# Patient Record
Sex: Male | Born: 1950 | Race: White | Hispanic: No | Marital: Married | Smoking: Never smoker
Health system: Southern US, Community
[De-identification: ages and names within clinical notes are randomized; demographics above are authoritative.]

## PROBLEM LIST (undated history)

## (undated) ENCOUNTER — Emergency Department (HOSPITAL_BASED_OUTPATIENT_CLINIC_OR_DEPARTMENT_OTHER): Admission: EM | Payer: Self-pay | Source: Home / Self Care

## (undated) DIAGNOSIS — H9190 Unspecified hearing loss, unspecified ear: Secondary | ICD-10-CM

## (undated) DIAGNOSIS — Z923 Personal history of irradiation: Secondary | ICD-10-CM

## (undated) DIAGNOSIS — I499 Cardiac arrhythmia, unspecified: Secondary | ICD-10-CM

## (undated) DIAGNOSIS — I493 Ventricular premature depolarization: Secondary | ICD-10-CM

## (undated) DIAGNOSIS — I82409 Acute embolism and thrombosis of unspecified deep veins of unspecified lower extremity: Secondary | ICD-10-CM

## (undated) DIAGNOSIS — E785 Hyperlipidemia, unspecified: Secondary | ICD-10-CM

## (undated) DIAGNOSIS — Z8719 Personal history of other diseases of the digestive system: Secondary | ICD-10-CM

## (undated) DIAGNOSIS — I471 Supraventricular tachycardia, unspecified: Secondary | ICD-10-CM

## (undated) DIAGNOSIS — I10 Essential (primary) hypertension: Secondary | ICD-10-CM

## (undated) DIAGNOSIS — C61 Malignant neoplasm of prostate: Secondary | ICD-10-CM

## (undated) DIAGNOSIS — K219 Gastro-esophageal reflux disease without esophagitis: Secondary | ICD-10-CM

## (undated) HISTORY — PX: COLONOSCOPY: SHX174

## (undated) HISTORY — PX: KNEE SURGERY: SHX244

---

## 1998-06-03 ENCOUNTER — Encounter: Payer: Self-pay | Admitting: Family Medicine

## 1998-06-03 ENCOUNTER — Encounter: Admission: RE | Admit: 1998-06-03 | Discharge: 1998-06-03 | Payer: Self-pay | Admitting: *Deleted

## 1998-09-27 ENCOUNTER — Ambulatory Visit (HOSPITAL_COMMUNITY): Admission: RE | Admit: 1998-09-27 | Discharge: 1998-09-27 | Payer: Self-pay

## 1998-09-27 ENCOUNTER — Encounter: Payer: Self-pay | Admitting: Gastroenterology

## 1999-03-09 ENCOUNTER — Inpatient Hospital Stay (HOSPITAL_COMMUNITY): Admission: RE | Admit: 1999-03-09 | Discharge: 1999-03-10 | Payer: Self-pay | Admitting: Neurosurgery

## 1999-03-09 ENCOUNTER — Encounter: Payer: Self-pay | Admitting: Neurosurgery

## 2001-07-15 ENCOUNTER — Ambulatory Visit (HOSPITAL_COMMUNITY): Admission: RE | Admit: 2001-07-15 | Discharge: 2001-07-15 | Payer: Self-pay | Admitting: Surgery

## 2002-03-21 ENCOUNTER — Encounter (INDEPENDENT_AMBULATORY_CARE_PROVIDER_SITE_OTHER): Payer: Self-pay | Admitting: *Deleted

## 2002-03-21 ENCOUNTER — Ambulatory Visit (HOSPITAL_COMMUNITY): Admission: RE | Admit: 2002-03-21 | Discharge: 2002-03-21 | Payer: Self-pay | Admitting: Gastroenterology

## 2002-08-07 HISTORY — PX: BACK SURGERY: SHX140

## 2003-04-08 ENCOUNTER — Encounter: Payer: Self-pay | Admitting: Cardiology

## 2003-04-08 ENCOUNTER — Ambulatory Visit (HOSPITAL_COMMUNITY): Admission: RE | Admit: 2003-04-08 | Discharge: 2003-04-08 | Payer: Self-pay | Admitting: Cardiology

## 2004-08-07 HISTORY — PX: HERNIA REPAIR: SHX51

## 2005-04-28 ENCOUNTER — Ambulatory Visit (HOSPITAL_BASED_OUTPATIENT_CLINIC_OR_DEPARTMENT_OTHER): Admission: RE | Admit: 2005-04-28 | Discharge: 2005-04-28 | Payer: Self-pay | Admitting: Surgery

## 2005-04-28 ENCOUNTER — Encounter (INDEPENDENT_AMBULATORY_CARE_PROVIDER_SITE_OTHER): Payer: Self-pay | Admitting: *Deleted

## 2007-11-14 ENCOUNTER — Encounter: Admission: RE | Admit: 2007-11-14 | Discharge: 2007-11-14 | Payer: Self-pay | Admitting: Family Medicine

## 2008-01-11 ENCOUNTER — Emergency Department (HOSPITAL_COMMUNITY): Admission: EM | Admit: 2008-01-11 | Discharge: 2008-01-11 | Payer: Self-pay | Admitting: Family Medicine

## 2008-09-20 ENCOUNTER — Ambulatory Visit: Payer: Self-pay | Admitting: Occupational Medicine

## 2008-09-22 ENCOUNTER — Telehealth (INDEPENDENT_AMBULATORY_CARE_PROVIDER_SITE_OTHER): Payer: Self-pay | Admitting: Occupational Medicine

## 2008-11-18 ENCOUNTER — Encounter: Payer: Self-pay | Admitting: Cardiology

## 2008-12-04 ENCOUNTER — Encounter: Payer: Self-pay | Admitting: Cardiology

## 2008-12-07 ENCOUNTER — Ambulatory Visit: Payer: Self-pay

## 2008-12-28 ENCOUNTER — Ambulatory Visit: Payer: Self-pay | Admitting: Cardiology

## 2008-12-28 DIAGNOSIS — R002 Palpitations: Secondary | ICD-10-CM | POA: Insufficient documentation

## 2008-12-28 DIAGNOSIS — I4949 Other premature depolarization: Secondary | ICD-10-CM | POA: Insufficient documentation

## 2008-12-29 ENCOUNTER — Encounter: Payer: Self-pay | Admitting: Cardiology

## 2008-12-29 ENCOUNTER — Ambulatory Visit: Payer: Self-pay

## 2009-01-29 ENCOUNTER — Encounter: Payer: Self-pay | Admitting: Cardiology

## 2009-02-02 ENCOUNTER — Ambulatory Visit: Payer: Self-pay | Admitting: Cardiology

## 2009-02-02 DIAGNOSIS — R079 Chest pain, unspecified: Secondary | ICD-10-CM | POA: Insufficient documentation

## 2009-02-02 DIAGNOSIS — E785 Hyperlipidemia, unspecified: Secondary | ICD-10-CM | POA: Insufficient documentation

## 2009-02-02 DIAGNOSIS — I10 Essential (primary) hypertension: Secondary | ICD-10-CM | POA: Insufficient documentation

## 2009-02-10 ENCOUNTER — Telehealth (INDEPENDENT_AMBULATORY_CARE_PROVIDER_SITE_OTHER): Payer: Self-pay

## 2009-02-11 ENCOUNTER — Ambulatory Visit: Payer: Self-pay

## 2009-02-11 ENCOUNTER — Encounter: Payer: Self-pay | Admitting: Cardiology

## 2009-02-15 ENCOUNTER — Telehealth: Payer: Self-pay | Admitting: Cardiology

## 2009-03-05 ENCOUNTER — Ambulatory Visit: Payer: Self-pay | Admitting: Cardiology

## 2010-12-23 NOTE — Op Note (Signed)
[place]  Patient:    [name], [name] Visit Number: [id number] MRN: [id number]          Service Type: DSU Location: DAY Attending Physician:  Andre Lefort Dictated by:   Sandria Bales. Ezzard Standing, M.D. Proc. Date: 07/15/01 Admit Date:  07/15/2001   CC:         [name]. Artis Flock, M.D.  Genene Churn. Sherin Quarry, M.D.   Operative Report  DATE OF BIRTH:  1951/07/15.  PREOPERATIVE DIAGNOSIS:  Right inguinal hernia.  POSTOPERATIVE DIAGNOSIS:  Medium-sized direct right inguinal hernia.  PROCEDURE:  Laparoscopic repair of right inguinal hernia with mesh.  SURGEON:  Sandria Bales. Ezzard Standing, M.D.  ANESTHESIA:  General endotracheal.  ESTIMATED BLOOD LOSS:  Minimal.  INDICATION FOR PROCEDURE:  [name] is a 60 year old white male who has a symptomatic right inguinal hernia and now comes for repair of this hernia. Discussed with him both open and laparoscopic techniques, and wanted to proceed with laparoscopic surgery.  Discussed with him the indications and complications of the procedure.  DESCRIPTION OF PROCEDURE:  The patient was placed in the supine position and given a general endotracheal anesthetic.  He was given 1 g of Ancef at the initiation of the procedure.  His abdomen was shaved, prepped with Betadine solution, and sterilely draped.  A Foley catheter was placed.  An infraumbilical incision was made with sharp dissection and carried down to the rectus abdominis fascia.  I then went to the right of the rectus abdominis, went to the right of the anterior rectus fascia on the right, retracted the rectus abdominis muscle anteriorly, and placed a preperitoneal balloon into the preperitoneal space.  I finished placing the balloon; however, the dissection was actually fairly poor as far as getting out to the pubic bone. I then placed a trocar in the left lower quadrant, dissected down to the pubic bone, identified pubic bone, Coopers ligament.   The patient had a medium to almost large direct hernia in the lateral inguinal floor, which was probably about 2-3 cm in diameter.  I freed up and reduce this hernia without difficulty.  I then placed a second trocar in the right lower quadrant, identified the cord structures.  He had a lipoma of the cord, which I reduced. I then encircled the cord, found no evidence of an indirect inguinal hernia.  I then carried out an inguinal hernia floor repair using a piece of mesh which was cut to 4 x 6 inches.  It was stapled using the [place] [employer] staples medially to the pubic tubercle, inferiorly to the Coopers ligament, superiorly to transversalis fascia, and superior laterally under direct palpation out to the shelving edge of the inguinal ligament.  I avoided the area lateral to the femoral vessels and inferior to the shelving edge of the inguinal ligament.  This mesh lay down flat and appeared to cover the hernia defect well.  I then removed the trocars under direct visualization.  There was no bleeding at either trocar site.  I closed the fascia with a 0 Vicryl suture and the skin with a 5-0 Vicryl suture, painted each wound with tincture of benzoin and Steri-Strips, and sterilely dressed the wound.  The patient tolerated the procedure well, will be discharged home today, return to see me in 10-14 days.  No driving for three or four days.  No heavy lifting for four weeks. Dictated by:   Sandria Bales. Ezzard Standing, M.D. Attending Physician:  Andre Lefort  DD:  07/15/01 TD:  07/15/01 Job: [id number]

## 2010-12-23 NOTE — Cardiovascular Report (Signed)
NAME:  [name], [name] NO.:  [id number]   MEDICAL RECORD NO.:  [id number]                   PATIENT TYPE:  OIB   LOCATION:  2870                                 FACILITY:  [facility]   PHYSICIAN:  Charlies Constable, M.D.                  DATE OF BIRTH:  06/10/1951   DATE OF PROCEDURE:  04/08/2003  DATE OF DISCHARGE:  04/08/2003                              CARDIAC CATHETERIZATION   CLINICAL HISTORY:  [name] is 60 years old and is the husband of [name] who works as a Engineer, civil (consulting) at [employer].  He works at [employer].  He  recently developed pressure in his chest with walking up a hill which  persisted.  He became a bit sweaty and warm.  He saw [name], M.D.  who was concerned about his symptoms and referred him to me for further  evaluation.  I saw him in consultation.  He has a positive family history  for coronary heart disease and has a history of hypertension and  hyperlipidemia.  His electrocardiogram also showed small inferior Q-waves.  We felt evaluation with angiography was the best approach.   PROCEDURE:  The procedure was performed via the right femoral artery  utilizing an arterial sheath and 6-French preformed coronary catheters.  A  front wall arterial puncture was performed and Omnipaque contrast was used.  A distal aortogram was performed to rule out abdominal aortic aneurysm and  evaluate for renovascular causes of hypertension.  The right femoral artery  was closed with Perclose at the end of the procedure.  The patient tolerated  the procedure well and left the laboratory in satisfactory condition.   RESULTS:  Left main coronary artery:  Free of significant disease.   Left anterior descending artery:  Gave rise to three septal perforators and  two diagonal branches.  The LAD was irregular in its proximal portion but  there was no significant obstruction.   Circumflex artery:  Gave rise to a marginal branch and two  posterolateral  branches.  These vessels were free of significant disease.   Right coronary artery:  Moderate sized vessel that gave rise to a conus  branch, right ventricular branch, a posterior descending branch, and a  posterolateral branch.  There were minimal irregularities in the proximal  portion.  There was no significant obstruction.   LEFT VENTRICULOGRAM:  The left ventriculogram performed in the RAO  projection showed good wall motion with no areas of hypokinesis.  The  estimated ejection fraction was 60.   DISTAL AORTOGRAM:  A distal aortogram was performed which showed patent  renal arteries and no significant aortoiliac obstruction.  The aortic  pressure was 127/85 with a mean of 105 and left ventricular pressure was  127/15.   CONCLUSIONS:  Normal coronary angiography with the exception of minimal  irregularity in the proximal left anterior descending and right coronary  arteries and normal left ventricular function.    RECOMMENDATIONS:  Reassurance.  In view of these findings I do not believe  the patient's recent symptoms were cardiac.  Will plan continued risk factor  modification.                                               Charlies Constable, M.D.    BB/MEDQ  D:  04/08/2003  T:  04/09/2003  Job:  [id number]   cc:   Jermaine Skye. [name], M.D.  [address], [address]  [place]  [place] [address]  Fax: [phone]   [facility]

## 2010-12-23 NOTE — Op Note (Signed)
NAME:  [name], [name]              ACCOUNT NO.:  [id number]   MEDICAL RECORD NO.:  [id number]          PATIENT TYPE:  AMB   LOCATION:                               FACILITY:  [facility]   PHYSICIAN:  Sandria Bales. Ezzard Standing, M.D.  DATE OF BIRTH:  [birth year]   DATE OF PROCEDURE:  04/28/2005  DATE OF DISCHARGE:                                 OPERATIVE REPORT   PREOPERATIVE DIAGNOSIS:  Lipoma left arm, 3 cm in size.   POSTOPERATIVE DIAGNOSIS:  Lipoma left arm, 3 cm in size.   PROCEDURE:  Excision lipoma left arm.   SURGEON:  Sandria Bales. Ezzard Standing, M.D.   ANESTHESIA:  10 mL of 1% Xylocaine.   COMPLICATIONS:  None.   INDICATIONS FOR PROCEDURE:  [name] is a 60 year old white male who has  a mass in his left arm that he wants excised.  It is bothering him a little  bit.  It has enlarged.   OPERATIVE NOTE:  With the patient in the supine position his left arm  prepped with Betadine solution and sterilely draped. The skin was  infiltrated with 1% Xylocaine.  Using about 10 mL I made a linear incision  over the lipoma.  I was able to remove the lipoma without difficulty. I  closed the skin in 2 layers with 5-0 Vicryl suture.  Painted the wound with  tincture of Benzoin and Steri-Strips.   The patient tolerated the procedure well and will be discharged home today;  and see me back in 2 weeks for followup.      Sandria Bales. Ezzard Standing, M.D.  Electronically Signed     DHN/MEDQ  D:  04/28/2005  T:  04/29/2005  Job:  [id number]   cc:   Quita Skye. Artis Flock, M.D.  Fax: [phone]

## 2011-06-26 ENCOUNTER — Encounter (HOSPITAL_BASED_OUTPATIENT_CLINIC_OR_DEPARTMENT_OTHER): Payer: Self-pay | Admitting: *Deleted

## 2011-06-26 NOTE — Progress Notes (Signed)
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 Bring meds and crutches

## 2011-07-03 ENCOUNTER — Other Ambulatory Visit: Payer: Self-pay | Admitting: Physician Assistant

## 2011-07-03 ENCOUNTER — Encounter (HOSPITAL_BASED_OUTPATIENT_CLINIC_OR_DEPARTMENT_OTHER)
Admission: RE | Admit: 2011-07-03 | Discharge: 2011-07-03 | Disposition: A | Discharge status: Home / Self Care | Payer: 59 | Source: Ambulatory Visit | Attending: Orthopedic Surgery | Admitting: Orthopedic Surgery

## 2011-07-03 ENCOUNTER — Other Ambulatory Visit: Payer: Self-pay

## 2011-07-03 DIAGNOSIS — S83249A Other tear of medial meniscus, current injury, unspecified knee, initial encounter: Secondary | ICD-10-CM | POA: Insufficient documentation

## 2011-07-03 LAB — BASIC METABOLIC PANEL
BUN: 16 mg/dL (ref 6–23)
CO2: 27 mEq/L (ref 19–32)
Calcium: 9.8 mg/dL (ref 8.4–10.5)
Chloride: 103 mEq/L (ref 96–112)
Creatinine, Ser: 1.07 mg/dL (ref 0.50–1.35)
GFR calc Af Amer: 85 mL/min — ABNORMAL LOW (ref >90)
GFR calc non Af Amer: 74 mL/min — ABNORMAL LOW (ref >90)
Glucose, Bld: 88 mg/dL (ref 70–99)
Potassium: 4.1 mEq/L (ref 3.5–5.1)
Sodium: 139 mEq/L (ref 135–145)

## 2011-07-03 NOTE — H&P (Signed)
[name] is an 60 y.o. male.   Chief Complaint: left knee medial meniscus tear HPI: [name] is a 60 year old seen at the request of Dr. Margaretha Sheffield for significant left knee pain secondary to a twisting injury that occurred 3-4 months ago. He's had significant sharp pain since then. Pain with twisting and turning with intermittent catching and popping in the knee. He had a left knee MRI on 06/12/11 that revealed a posterior horn medial meniscus tear. He's having significant persistent pain and mechanical symptoms for over 3 months.  Past Medical History  Diagnosis Date  . Hypertension   . Hyperlipemia   . GERD (gastroesophageal reflux disease)     Past Surgical History  Procedure Date  . Hernia repair 2006    rt ing h  . Back surgery 2004    lumb lam  . Colonoscopy     No family history on file. Social History:  reports that he has never smoked. He does not have any smokeless tobacco history on file. He reports that he drinks alcohol. He reports that he does not use illicit drugs.  Allergies:  Allergies  Allergen Reactions  . Penicillins Hives  . Polymyxin B Hives    Medications Prior to Admission  Medication Sig Dispense Refill  . aspirin 81 MG chewable tablet Chew 81 mg by mouth daily.        Marland Kitchen esomeprazole (NEXIUM) 40 MG capsule Take 40 mg by mouth daily before breakfast.        . fenofibrate (TRICOR) 145 MG tablet Take 145 mg by mouth daily.        Marland Kitchen ibuprofen (ADVIL,MOTRIN) 200 MG tablet Take 200 mg by mouth every 6 (six) hours as needed.        . metoprolol succinate (TOPROL-XL) 25 MG 24 hr tablet Take 25 mg by mouth daily.        Marland Kitchen omega-3 acid ethyl esters (LOVAZA) 1 G capsule Take 2 g by mouth 1 day or 1 dose.        . simvastatin (ZOCOR) 40 MG tablet Take 40 mg by mouth every morning.        . valsartan-hydrochlorothiazide (DIOVAN-HCT) 160-12.5 MG per tablet Take 1 tablet by mouth daily.         No current facility-administered medications on file as of  07/03/2011.    Results for orders placed during the hospital encounter of 07/04/11 (from the past 48 hour(s))  BASIC METABOLIC PANEL     Status: Abnormal   Collection Time   07/03/11  9:00 AM      Component Value Range Comment   Sodium 139  135 - 145 (mEq/L)    Potassium 4.1  3.5 - 5.1 (mEq/L)    Chloride 103  96 - 112 (mEq/L)    CO2 27  19 - 32 (mEq/L)    Glucose, Bld 88  70 - 99 (mg/dL)    BUN 16  6 - 23 (mg/dL)    Creatinine, Ser 1.61  0.50 - 1.35 (mg/dL)    Calcium 9.8  8.4 - 10.5 (mg/dL)    GFR calc non Af Amer 74 (*) >90 (mL/min)    GFR calc Af Amer 85 (*) >90 (mL/min)    No results found.  Review of Systems  Constitutional: Negative.   HENT: Negative.   Eyes: Negative.   Respiratory: Negative.   Cardiovascular: Negative.   Gastrointestinal: Negative.   Genitourinary: Negative.   Musculoskeletal: Positive for joint pain.  Left knee pain  Skin: Negative.   Neurological: Negative.   Endo/Heme/Allergies: Negative.   Psychiatric/Behavioral: Negative.     Blood pressure 117/68, height 6\' 2"  (1.88 m), weight 103.42 kg (228 lb). Physical Exam  Constitutional: He is oriented to person, place, and time. He appears well-developed and well-nourished.  HENT:  Head: Normocephalic and atraumatic.  Mouth/Throat: Oropharynx is clear and moist.  Eyes: Conjunctivae and EOM are normal. Pupils are equal, round, and reactive to light.  Neck: Neck supple.  Cardiovascular: Normal rate.   Respiratory: Effort normal.  GI: Soft.  Genitourinary:       Not pertinent to current symptomatology therefore not examined.  Musculoskeletal:       Examination of his left knee reveals point tenderness over the medial joint line positive medial McMurray's 1+ effusion, full range of motion knee is stable with normal patella tracking.  Exam of the right knee reveals full range of motion without pain swelling weakness or instability. Vascular exam: pulses 2+ and symmetric.  Neurological: He is  alert and oriented to person, place, and time.  Skin: Skin is warm and dry.  Psychiatric: He has a normal mood and affect. His behavior is normal. Judgment and thought content normal.     Assessment/Plan Patient Active Problem List  Diagnoses  . HYPERLIPIDEMIA-MIXED  . HYPERTENSION, UNSPECIFIED  . PREMATURE VENTRICULAR CONTRACTIONS  . PALPITATIONS  . CHEST PAIN UNSPECIFIED  . Medial meniscus tear   I talk to him in detail about this. Would recommend due to significant pain and lack of response to conservative care that we proceed with left knee arthroscopy for partial medial meniscectomy and attention to any other meniscal pathology. Discussed risks benefits and possible complications of the surgery in detail and he understands this completely.   [name] J 07/03/2011, 3:12 PM

## 2011-07-04 ENCOUNTER — Ambulatory Visit (HOSPITAL_BASED_OUTPATIENT_CLINIC_OR_DEPARTMENT_OTHER): Payer: 59 | Admitting: Certified Registered"

## 2011-07-04 ENCOUNTER — Ambulatory Visit (HOSPITAL_BASED_OUTPATIENT_CLINIC_OR_DEPARTMENT_OTHER)
Admission: RE | Admit: 2011-07-04 | Discharge: 2011-07-04 | Disposition: A | Discharge status: Home / Self Care | Payer: 59 | Source: Ambulatory Visit | Attending: Orthopedic Surgery | Admitting: Orthopedic Surgery

## 2011-07-04 ENCOUNTER — Encounter (HOSPITAL_BASED_OUTPATIENT_CLINIC_OR_DEPARTMENT_OTHER): Payer: Self-pay | Admitting: Certified Registered"

## 2011-07-04 ENCOUNTER — Encounter (HOSPITAL_BASED_OUTPATIENT_CLINIC_OR_DEPARTMENT_OTHER): Payer: Self-pay

## 2011-07-04 ENCOUNTER — Encounter (HOSPITAL_BASED_OUTPATIENT_CLINIC_OR_DEPARTMENT_OTHER)
Admission: RE | Disposition: A | Discharge status: Home / Self Care | Payer: Self-pay | Source: Ambulatory Visit | Attending: Orthopedic Surgery

## 2011-07-04 ENCOUNTER — Encounter (HOSPITAL_BASED_OUTPATIENT_CLINIC_OR_DEPARTMENT_OTHER): Payer: Self-pay | Admitting: Anesthesiology

## 2011-07-04 DIAGNOSIS — M9420 Chondromalacia, unspecified site: Secondary | ICD-10-CM | POA: Insufficient documentation

## 2011-07-04 DIAGNOSIS — M23329 Other meniscus derangements, posterior horn of medial meniscus, unspecified knee: Secondary | ICD-10-CM | POA: Insufficient documentation

## 2011-07-04 DIAGNOSIS — K219 Gastro-esophageal reflux disease without esophagitis: Secondary | ICD-10-CM | POA: Insufficient documentation

## 2011-07-04 DIAGNOSIS — M23302 Other meniscus derangements, unspecified lateral meniscus, unspecified knee: Secondary | ICD-10-CM | POA: Insufficient documentation

## 2011-07-04 DIAGNOSIS — Z01812 Encounter for preprocedural laboratory examination: Secondary | ICD-10-CM | POA: Insufficient documentation

## 2011-07-04 DIAGNOSIS — I10 Essential (primary) hypertension: Secondary | ICD-10-CM | POA: Insufficient documentation

## 2011-07-04 DIAGNOSIS — Z01810 Encounter for preprocedural cardiovascular examination: Secondary | ICD-10-CM | POA: Insufficient documentation

## 2011-07-04 HISTORY — DX: Essential (primary) hypertension: I10

## 2011-07-04 HISTORY — DX: Hyperlipidemia, unspecified: E78.5

## 2011-07-04 HISTORY — DX: Gastro-esophageal reflux disease without esophagitis: K21.9

## 2011-07-04 LAB — POCT HEMOGLOBIN-HEMACUE: Hemoglobin: 13 g/dL (ref 13.0–17.0)

## 2011-07-04 SURGERY — ARTHROSCOPY, KNEE, WITH MEDIAL MENISCECTOMY
Anesthesia: General | Site: Knee | Laterality: Left | Wound class: Clean

## 2011-07-04 MED ORDER — BUPIVACAINE HCL (PF) 0.25 % IJ SOLN
INTRAMUSCULAR | Status: DC | PRN
  Administered 2011-07-04: 15 mL

## 2011-07-04 MED ORDER — LACTATED RINGERS IV SOLN: 500.0000 mL | INTRAVENOUS | Status: DC

## 2011-07-04 MED ORDER — LIDOCAINE-PRILOCAINE 2.5-2.5 % EX CREA: 1.0000 "application " | TOPICAL_CREAM | Freq: Once | CUTANEOUS | Status: DC

## 2011-07-04 MED ORDER — DROPERIDOL 2.5 MG/ML IJ SOLN
INTRAMUSCULAR | Status: DC | PRN
  Administered 2011-07-04: 0.625 mg via INTRAVENOUS

## 2011-07-04 MED ORDER — CHLORHEXIDINE GLUCONATE 4 % EX LIQD: 60.0000 mL | Freq: Once | CUTANEOUS | Status: DC

## 2011-07-04 MED ORDER — PROPOFOL 10 MG/ML IV EMUL
INTRAVENOUS | Status: DC | PRN
  Administered 2011-07-04: 250 mg via INTRAVENOUS

## 2011-07-04 MED ORDER — POVIDONE-IODINE 7.5 % EX SOLN: Freq: Once | CUTANEOUS | Status: DC

## 2011-07-04 MED ORDER — LIDOCAINE HCL 1.5 % IJ SOLN
INTRAMUSCULAR | Status: DC | PRN
  Administered 2011-07-04: 20 mL via INTRADERMAL

## 2011-07-04 MED ORDER — LACTATED RINGERS IV SOLN
INTRAVENOUS | Status: DC
  Administered 2011-07-04: 13:00:00 via INTRAVENOUS

## 2011-07-04 MED ORDER — VANCOMYCIN HCL 1000 MG IV SOLR
1000.0000 mg | INTRAVENOUS | Status: DC | PRN
  Administered 2011-07-04: 1.5 g via INTRAVENOUS

## 2011-07-04 MED ORDER — SODIUM CHLORIDE 0.9 % IR SOLN
Status: DC | PRN
  Administered 2011-07-04: 16:00:00

## 2011-07-04 MED ORDER — OXYMETAZOLINE HCL 0.05 % NA SOLN: 2.0000 | Freq: Once | NASAL | Status: DC

## 2011-07-04 MED ORDER — VANCOMYCIN HCL 1000 MG IV SOLR: 1500.0000 mg | INTRAVENOUS | Status: DC

## 2011-07-04 MED ORDER — MIDAZOLAM HCL 2 MG/2ML IJ SOLN
1.0000 mg | INTRAMUSCULAR | Status: DC | PRN
  Administered 2011-07-04: 2 mg via INTRAVENOUS

## 2011-07-04 MED ORDER — KETOROLAC TROMETHAMINE 30 MG/ML IJ SOLN
INTRAMUSCULAR | Status: DC | PRN
  Administered 2011-07-04: 30 mg via INTRAVENOUS

## 2011-07-04 MED ORDER — FENTANYL CITRATE 0.05 MG/ML IJ SOLN
INTRAMUSCULAR | Status: DC | PRN
  Administered 2011-07-04: 50 ug via INTRAVENOUS

## 2011-07-04 MED ORDER — DEXAMETHASONE SODIUM PHOSPHATE 4 MG/ML IJ SOLN
INTRAMUSCULAR | Status: DC | PRN
  Administered 2011-07-04: 10 mg via INTRAVENOUS

## 2011-07-04 MED ORDER — LACTATED RINGERS IV SOLN: INTRAVENOUS | Status: DC

## 2011-07-04 MED ORDER — FENTANYL CITRATE 0.05 MG/ML IJ SOLN
50.0000 ug | INTRAMUSCULAR | Status: DC | PRN
  Administered 2011-07-04: 100 ug via INTRAVENOUS

## 2011-07-04 MED ORDER — MIDAZOLAM HCL 2 MG/2ML IJ SOLN: 0.5000 mg | INTRAMUSCULAR | Status: DC | PRN

## 2011-07-04 SURGICAL SUPPLY — 54 items
APL SKNCLS STERI-STRIP NONHPOA (GAUZE/BANDAGES/DRESSINGS)
BANDAGE ELASTIC 6 VELCRO ST LF (GAUZE/BANDAGES/DRESSINGS) ×2 IMPLANT
BANDAGE ESMARK 6X9 LF (GAUZE/BANDAGES/DRESSINGS) IMPLANT
BENZOIN TINCTURE PRP APPL 2/3 (GAUZE/BANDAGES/DRESSINGS) IMPLANT
BLADE GREAT WHITE 4.2 (BLADE) ×2 IMPLANT
BLADE SURG 15 STRL LF DISP TIS (BLADE) IMPLANT
BLADE SURG 15 STRL SS (BLADE)
BNDG CMPR 9X6 STRL LF SNTH (GAUZE/BANDAGES/DRESSINGS)
BNDG COHESIVE 4X5 TAN STRL (GAUZE/BANDAGES/DRESSINGS) IMPLANT
BNDG ESMARK 6X9 LF (GAUZE/BANDAGES/DRESSINGS)
CANISTER OMNI JUG 16 LITER (MISCELLANEOUS) IMPLANT
CANISTER SUCTION 2500CC (MISCELLANEOUS) IMPLANT
DRAPE ARTHROSCOPY W/POUCH 90 (DRAPES) ×2 IMPLANT
DURAPREP 26ML APPLICATOR (WOUND CARE) ×2 IMPLANT
GAUZE XEROFORM 1X8 LF (GAUZE/BANDAGES/DRESSINGS) ×2 IMPLANT
GLOVE BIO SURGEON STRL SZ7 (GLOVE) ×2 IMPLANT
GLOVE BIOGEL PI IND STRL 7.0 (GLOVE) ×1 IMPLANT
GLOVE BIOGEL PI IND STRL 7.5 (GLOVE) ×1 IMPLANT
GLOVE BIOGEL PI INDICATOR 7.0 (GLOVE) ×1
GLOVE BIOGEL PI INDICATOR 7.5 (GLOVE) ×1
GLOVE SS BIOGEL STRL SZ 7.5 (GLOVE) ×1 IMPLANT
GLOVE SUPERSENSE BIOGEL SZ 7.5 (GLOVE) ×1
GOWN PREVENTION PLUS XLARGE (GOWN DISPOSABLE) ×3 IMPLANT
HOLDER KNEE FOAM BLUE (MISCELLANEOUS) ×2 IMPLANT
KNEE WRAP E Z 3 GEL PACK (MISCELLANEOUS) ×2 IMPLANT
KWIRE 4.0 X .062IN (WIRE) IMPLANT
NDL SAFETY ECLIPSE 18X1.5 (NEEDLE) ×2 IMPLANT
NEEDLE HYPO 18GX1.5 SHARP (NEEDLE) ×2
NEEDLE HYPO 22GX1.5 SAFETY (NEEDLE) IMPLANT
PACK ARTHROSCOPY DSU (CUSTOM PROCEDURE TRAY) ×2 IMPLANT
PACK BASIN DAY SURGERY FS (CUSTOM PROCEDURE TRAY) ×2 IMPLANT
PAD ALCOHOL SWAB (MISCELLANEOUS) IMPLANT
PADDING WEBRIL 6 STERILE (GAUZE/BANDAGES/DRESSINGS) ×1 IMPLANT
SET ARTHROSCOPY TUBING (MISCELLANEOUS) ×2
SET ARTHROSCOPY TUBING LN (MISCELLANEOUS) ×1 IMPLANT
SPONGE GAUZE 4X4 12PLY (GAUZE/BANDAGES/DRESSINGS) ×2 IMPLANT
STRIP CLOSURE SKIN 1/2X4 (GAUZE/BANDAGES/DRESSINGS) IMPLANT
SUCTION FRAZIER TIP 10 FR DISP (SUCTIONS) IMPLANT
SUT ETHILON 4 0 PS 2 18 (SUTURE) ×2 IMPLANT
SUT FIBERWIRE #2 38 T-5 BLUE (SUTURE)
SUT PDS AB 0 CT 36 (SUTURE) IMPLANT
SUT PROLENE 3 0 PS 2 (SUTURE) IMPLANT
SUT VIC AB 0 CT1 18XCR BRD 8 (SUTURE) IMPLANT
SUT VIC AB 0 CT1 8-18 (SUTURE)
SUT VIC AB 2-0 CT1 27 (SUTURE)
SUT VIC AB 2-0 CT1 TAPERPNT 27 (SUTURE) IMPLANT
SUT VIC AB 3-0 PS1 18 (SUTURE)
SUT VIC AB 3-0 PS1 18XBRD (SUTURE) IMPLANT
SUTURE FIBERWR #2 38 T-5 BLUE (SUTURE) IMPLANT
SYR 20CC LL (SYRINGE) IMPLANT
SYR 5ML LL (SYRINGE) ×2 IMPLANT
TOWEL OR 17X24 6PK STRL BLUE (TOWEL DISPOSABLE) ×2 IMPLANT
WAND STAR VAC 90 (SURGICAL WAND) IMPLANT
WATER STERILE IRR 1000ML POUR (IV SOLUTION) ×2 IMPLANT

## 2011-07-04 NOTE — Anesthesia Postprocedure Evaluation (Signed)
Anesthesia Post Note  Patient: [name]  Procedure(s) Performed:  KNEE ARTHROSCOPY WITH MEDIAL MENISECTOMY - Left knee block in pre-op per anesthesia  Anesthesia type: General  Patient location: PACU  Post pain: Pain level controlled  Post assessment: Patient's Cardiovascular Status Stable  Last Vitals:  Filed Vitals:   07/04/11 1553  BP:   Pulse: 73  Temp:   Resp: 9    Post vital signs: Reviewed and stable  Level of consciousness: alert  Complications: No apparent anesthesia complications

## 2011-07-04 NOTE — Interval H&P Note (Signed)
History and Physical Interval Note:   07/04/2011   2:20 PM   [name]  has presented today for surgery, with the diagnosis of mmt left  The various methods of treatment have been discussed with the patient and family. After consideration of risks, benefits and other options for treatment, the patient has consented to  Procedure(s): KNEE ARTHROSCOPY WITH MEDIAL MENISECTOMY as a surgical intervention .  The patients' history has been reviewed, patient examined, no change in status, stable for surgery.  I have reviewed the patients' chart and labs.  Questions were answered to the patient's satisfaction.     Nilda Simmer  MD

## 2011-07-04 NOTE — Transfer of Care (Signed)
Immediate Anesthesia Transfer of Care Note  Patient: [name]  Procedure(s) Performed:  KNEE ARTHROSCOPY WITH MEDIAL MENISECTOMY - Left knee block in pre-op per anesthesia  Patient Location: PACU  Anesthesia Type: General  Level of Consciousness: awake, alert  and oriented  Airway & Oxygen Therapy: Patient Spontanous Breathing  Post-op Assessment: Report given to PACU RN and Post -op Vital signs reviewed and stable  Post vital signs: Reviewed and stable  Complications: No apparent anesthesia complications

## 2011-07-04 NOTE — H&P (View-Only) (Signed)
[name] is an 59 y.o. male.   Chief Complaint: left knee medial meniscus tear HPI: [name] is a 59 year old seen at the request of Dr. Draper for significant left knee pain secondary to a twisting injury that occurred 3-4 months ago. He's had significant sharp pain since then. Pain with twisting and turning with intermittent catching and popping in the knee. He had a left knee MRI on 06/12/11 that revealed a posterior horn medial meniscus tear. He's having significant persistent pain and mechanical symptoms for over 3 months.  Past Medical History  Diagnosis Date  . Hypertension   . Hyperlipemia   . GERD (gastroesophageal reflux disease)     Past Surgical History  Procedure Date  . Hernia repair 2006    rt ing h  . Back surgery 2004    lumb lam  . Colonoscopy     No family history on file. Social History:  reports that he has never smoked. He does not have any smokeless tobacco history on file. He reports that he drinks alcohol. He reports that he does not use illicit drugs.  Allergies:  Allergies  Allergen Reactions  . Penicillins Hives  . Polymyxin B Hives    Medications Prior to Admission  Medication Sig Dispense Refill  . aspirin 81 MG chewable tablet Chew 81 mg by mouth daily.        . esomeprazole (NEXIUM) 40 MG capsule Take 40 mg by mouth daily before breakfast.        . fenofibrate (TRICOR) 145 MG tablet Take 145 mg by mouth daily.        . ibuprofen (ADVIL,MOTRIN) 200 MG tablet Take 200 mg by mouth every 6 (six) hours as needed.        . metoprolol succinate (TOPROL-XL) 25 MG 24 hr tablet Take 25 mg by mouth daily.        . omega-3 acid ethyl esters (LOVAZA) 1 G capsule Take 2 g by mouth 1 day or 1 dose.        . simvastatin (ZOCOR) 40 MG tablet Take 40 mg by mouth every morning.        . valsartan-hydrochlorothiazide (DIOVAN-HCT) 160-12.5 MG per tablet Take 1 tablet by mouth daily.         No current facility-administered medications on file as of  07/03/2011.    Results for orders placed during the hospital encounter of 07/04/11 (from the past 48 hour(s))  BASIC METABOLIC PANEL     Status: Abnormal   Collection Time   07/03/11  9:00 AM      Component Value Range Comment   Sodium 139  135 - 145 (mEq/L)    Potassium 4.1  3.5 - 5.1 (mEq/L)    Chloride 103  96 - 112 (mEq/L)    CO2 27  19 - 32 (mEq/L)    Glucose, Bld 88  70 - 99 (mg/dL)    BUN 16  6 - 23 (mg/dL)    Creatinine, Ser 1.07  0.50 - 1.35 (mg/dL)    Calcium 9.8  8.4 - 10.5 (mg/dL)    GFR calc non Af Amer 74 (*) >90 (mL/min)    GFR calc Af Amer 85 (*) >90 (mL/min)    No results found.  Review of Systems  Constitutional: Negative.   HENT: Negative.   Eyes: Negative.   Respiratory: Negative.   Cardiovascular: Negative.   Gastrointestinal: Negative.   Genitourinary: Negative.   Musculoskeletal: Positive for joint pain.         Left knee pain  Skin: Negative.   Neurological: Negative.   Endo/Heme/Allergies: Negative.   Psychiatric/Behavioral: Negative.     Blood pressure 117/68, height 6' 2" (1.88 m), weight 103.42 kg (228 lb). Physical Exam  Constitutional: He is oriented to person, place, and time. He appears well-developed and well-nourished.  HENT:  Head: Normocephalic and atraumatic.  Mouth/Throat: Oropharynx is clear and moist.  Eyes: Conjunctivae and EOM are normal. Pupils are equal, round, and reactive to light.  Neck: Neck supple.  Cardiovascular: Normal rate.   Respiratory: Effort normal.  GI: Soft.  Genitourinary:       Not pertinent to current symptomatology therefore not examined.  Musculoskeletal:       Examination of his left knee reveals point tenderness over the medial joint line positive medial McMurray's 1+ effusion, full range of motion knee is stable with normal patella tracking.  Exam of the right knee reveals full range of motion without pain swelling weakness or instability. Vascular exam: pulses 2+ and symmetric.  Neurological: He is  alert and oriented to person, place, and time.  Skin: Skin is warm and dry.  Psychiatric: He has a normal mood and affect. His behavior is normal. Judgment and thought content normal.     Assessment/Plan Patient Active Problem List  Diagnoses  . HYPERLIPIDEMIA-MIXED  . HYPERTENSION, UNSPECIFIED  . PREMATURE VENTRICULAR CONTRACTIONS  . PALPITATIONS  . CHEST PAIN UNSPECIFIED  . Medial meniscus tear   I talk to him in detail about this. Would recommend due to significant pain and lack of response to conservative care that we proceed with left knee arthroscopy for partial medial meniscectomy and attention to any other meniscal pathology. Discussed risks benefits and possible complications of the surgery in detail and he understands this completely.   Lashone Stauber J 07/03/2011, 3:12 PM    

## 2011-07-04 NOTE — Anesthesia Procedure Notes (Addendum)
Anesthesia Regional Block:   Narrative:    Procedure Name: LMA Insertion Performed by: Sharyne Richters Pre-anesthesia Checklist: Patient identified, Emergency Drugs available, Suction available, Patient being monitored and Timeout performed Patient Re-evaluated:Patient Re-evaluated prior to inductionOxygen Delivery Method: Circle System Utilized Preoxygenation: Pre-oxygenation with 100% oxygen Intubation Type: IV induction Ventilation: Mask ventilation without difficulty LMA: LMA with gastric port inserted LMA Size: 4.0 Number of attempts: 1 Placement Confirmation: breath sounds checked- equal and bilateral and positive ETCO2 Tube secured with: Tape Dental Injury: Teeth and Oropharynx as per pre-operative assessment    Patient was identified and time out performed. Intravenous sedation performed by RN in attendance. Knee was prepped and draped in a sterile fashion using betadine spray. Using sterile technique, 7.5 cc of Marcaine 0.25% was infiltrated over the 2 inferior portals of the operative knee using a 25g. 1.5 inch needle. An 18g. 1.5 inch needle was then used to inject 20 cc of 1.5% lidocaine with 1:200,000 epinephrine into the operative knee. The patient tolerated the procedure well.

## 2011-07-04 NOTE — Anesthesia Preprocedure Evaluation (Addendum)
Anesthesia Evaluation  Patient identified by MRN, date of birth, ID band Patient awake    Reviewed: Allergy & Precautions, H&P , NPO status , Patient's Chart, lab work & pertinent test results, reviewed documented beta blocker date and time   Airway Mallampati: II TM Distance: >3 FB Neck ROM: full    Dental   Pulmonary neg pulmonary ROS,          Cardiovascular hypertension, On Medications + dysrhythmias     Neuro/Psych Negative Neurological ROS  Negative Psych ROS   GI/Hepatic Neg liver ROS, GERD-  Medicated,  Endo/Other  Negative Endocrine ROS  Renal/GU negative Renal ROS  Genitourinary negative   Musculoskeletal   Abdominal   Peds  Hematology negative hematology ROS (+)   Anesthesia Other Findings See surgeon's H&P   Reproductive/Obstetrics negative OB ROS                           Anesthesia Physical Anesthesia Plan  ASA: II  Anesthesia Plan: General   Post-op Pain Management:    Induction: Intravenous  Airway Management Planned: LMA  Additional Equipment:   Intra-op Plan:   Post-operative Plan:   Informed Consent: I have reviewed the patients History and Physical, chart, labs and discussed the procedure including the risks, benefits and alternatives for the proposed anesthesia with the patient or authorized representative who has indicated his/her understanding and acceptance.     Plan Discussed with: CRNA and Surgeon  Anesthesia Plan Comments: (Intra-articular knee injection panned per surgeon request.)       Anesthesia Quick Evaluation

## 2011-07-04 NOTE — Brief Op Note (Signed)
07/04/2011  3:48 PM  PATIENT:  Desma Mcgregor  60 y.o. male  PRE-OPERATIVE DIAGNOSIS:  mmt left  POST-OPERATIVE DIAGNOSIS:  Left Medial Meniscal Tear  PROCEDURE:  Procedure(s): KNEE ARTHROSCOPY WITH MEDIAL AND LATERAL MENISECTOMY  SURGEON:  Surgeon(s): Nilda Simmer, MD  PHYSICIAN ASSISTANT:   ASSISTANTS:  ANESTHESIA:   general  EBL:  Total I/O In: 1000 [I.V.:1000] Out: -   BLOOD ADMINISTERED:none  DRAINS: none   LOCAL MEDICATIONS USED:  NONE  SPECIMEN:  No Specimen  DISPOSITION OF SPECIMEN:  N/A  COUNTS:  YES  TOURNIQUET:  * No tourniquets in log *  DICTATION: .Other Dictation: Dictation Number [id number]  PLAN OF CARE: Discharge to home after PACU  PATIENT DISPOSITION:  PACU - hemodynamically stable.   Delay start of Pharmacological VTE agent (>24hrs) due to surgical blood loss or risk of bleeding: NA

## 2011-07-05 NOTE — Op Note (Signed)
NAME:  [name], [name]           ACCOUNT NO.:  [id number]  MEDICAL RECORD NO.:  [id number]  LOCATION:                                 FACILITY:  PHYSICIAN:  Elana Alm. Thurston Klein, M.D.      DATE OF BIRTH:  DATE OF PROCEDURE:  07/04/2011 DATE OF DISCHARGE:                              OPERATIVE REPORT   PREOPERATIVE DIAGNOSIS:  Left knee medial lateral meniscal tears.  POSTOP DIAGNOSIS:  Left knee medial lateral meniscal tears.  PROCEDURE:  Left knee EUA, followed by arthroscopic partial medial lateral meniscectomies.  SURGEON:  Elana Alm. Thurston Hole, MD  ANESTHESIA:  General.  OPERATIVE TIME:  30 minutes.  COMPLICATIONS:  None.  INDICATION FOR PROCEDURE:  [name] is a 60 year old gentleman who has had 4 months of increasing left knee pain with exam and MRI taken, documenting meniscal tearing secondary to twisting injury.  He has failed conservative care and is now to undergo arthroscopy.  DESCRIPTION OF PROCEDURE:  [name] was brought to the operating room on July 04, 2011, after a knee block was placed in the holding room by Anesthesia.  He was placed on operative table in supine position. After being placed under general anesthesia, he received vancomycin 1.5 g IV preoperatively for prophylaxis.  His left knee was examined under anesthesia.  He had full range of motion.  Knee was stable, ligamentous exam with normal patellar tracking.  Left leg was prepped using sterile DuraPrep and draped using sterile technique.  Time-out procedure was called, and the correct left knee identified.  Initially, through an anterolateral portal, the arthroscope with a pump attached was placed into an anteromedial portal, an arthroscopic probe was placed.  On initial inspection medial compartment, he had grade 1 and 2 chondromalacia.  Medial meniscus showed complex tearing of the posterior medial horn of which 40%-50% was resected back to stable rim. Intercondylar notch inspected,  anterior posterior cruciate ligaments were normal.  Lateral compartment inspected.  The articular cartilage was normal.  Lateral meniscus showed a small posterolateral corner tear 15% which was resected back to a stable rim.  Patellofemoral joint showed grade 1 and 2 chondromalacia.  The patella tracked normally. Medial and lateral gutters were free of pathology.  After this is done, it is felt that all pathology has been satisfactorily addressed.  The instruments removed.  Portals closed with 3-0 nylon sutures.  Sterile dressings were applied.  The patient awakened and taken to recovery room in stable condition.  FOLLOWUP CARE:  [name] will be followed as an outpatient on Norco for pain.  He will be seen back in my office in a week for sutures out and followup.     [name], M.D.     RAW/MEDQ  D:  07/04/2011  T:  07/04/2011  Job:  [id number]

## 2011-07-11 ENCOUNTER — Other Ambulatory Visit: Payer: Self-pay | Admitting: Orthopedic Surgery

## 2011-07-11 ENCOUNTER — Encounter (INDEPENDENT_AMBULATORY_CARE_PROVIDER_SITE_OTHER): Payer: 59 | Admitting: *Deleted

## 2011-07-11 DIAGNOSIS — M79609 Pain in unspecified limb: Secondary | ICD-10-CM

## 2011-07-11 DIAGNOSIS — I8000 Phlebitis and thrombophlebitis of superficial vessels of unspecified lower extremity: Secondary | ICD-10-CM

## 2011-08-18 ENCOUNTER — Telehealth: Payer: Self-pay | Admitting: Cardiology

## 2011-09-08 NOTE — Telephone Encounter (Signed)
Discussed with manager, who advised to document as a phone note, since we do not have medical clarification of the reaction.  Possible allergy to ultrasound gel.

## 2012-04-03 ENCOUNTER — Ambulatory Visit (INDEPENDENT_AMBULATORY_CARE_PROVIDER_SITE_OTHER): Payer: 59 | Admitting: *Deleted

## 2012-04-03 DIAGNOSIS — I82409 Acute embolism and thrombosis of unspecified deep veins of unspecified lower extremity: Secondary | ICD-10-CM

## 2013-01-25 ENCOUNTER — Emergency Department (HOSPITAL_BASED_OUTPATIENT_CLINIC_OR_DEPARTMENT_OTHER)
Admission: EM | Admit: 2013-01-25 | Discharge: 2013-01-25 | Disposition: A | Discharge status: Home / Self Care | Payer: 59 | Attending: Emergency Medicine | Admitting: Emergency Medicine

## 2013-01-25 ENCOUNTER — Emergency Department: Admission: EM | Admit: 2013-01-25 | Discharge: 2013-01-25 | Payer: 59 | Source: Home / Self Care

## 2013-01-25 ENCOUNTER — Encounter (HOSPITAL_BASED_OUTPATIENT_CLINIC_OR_DEPARTMENT_OTHER): Payer: Self-pay

## 2013-01-25 DIAGNOSIS — Z880 Allergy status to penicillin: Secondary | ICD-10-CM | POA: Insufficient documentation

## 2013-01-25 DIAGNOSIS — Y9389 Activity, other specified: Secondary | ICD-10-CM | POA: Insufficient documentation

## 2013-01-25 DIAGNOSIS — K219 Gastro-esophageal reflux disease without esophagitis: Secondary | ICD-10-CM | POA: Insufficient documentation

## 2013-01-25 DIAGNOSIS — E785 Hyperlipidemia, unspecified: Secondary | ICD-10-CM | POA: Insufficient documentation

## 2013-01-25 DIAGNOSIS — W64XXXA Exposure to other animate mechanical forces, initial encounter: Secondary | ICD-10-CM | POA: Insufficient documentation

## 2013-01-25 DIAGNOSIS — Y9289 Other specified places as the place of occurrence of the external cause: Secondary | ICD-10-CM | POA: Insufficient documentation

## 2013-01-25 DIAGNOSIS — I10 Essential (primary) hypertension: Secondary | ICD-10-CM | POA: Insufficient documentation

## 2013-01-25 DIAGNOSIS — Z79899 Other long term (current) drug therapy: Secondary | ICD-10-CM | POA: Insufficient documentation

## 2013-01-25 DIAGNOSIS — Z7982 Long term (current) use of aspirin: Secondary | ICD-10-CM | POA: Insufficient documentation

## 2013-01-25 DIAGNOSIS — IMO0002 Reserved for concepts with insufficient information to code with codable children: Secondary | ICD-10-CM | POA: Insufficient documentation

## 2013-01-25 NOTE — ED Provider Notes (Signed)
History     CSN: [id number]  Arrival date & time 01/25/13  8657   First MD Initiated Contact with Patient 01/25/13 1000      Chief Complaint  Patient presents with  . Rabies Injection     HPI Patient reports that his domestic cat was noted to have an injury to his foot on May 28 of this year.  About a week later he saw a Fox in his yard.  A fox acted normally. About 3 days ago he was playing with his cat in the stretched him which is also not abnormal.  The cat apparently is not up-to-date on his rabies vaccinations and he went to his veterinarian who is unsure but told him his cat might need to be quarantined for 6 months.  There was never any documented exposure of the cat and a fox.  The cat if it was exposed to the Fox was exposed over 3 weeks ago.  The cat is acting and eating normally.  Past Medical History  Diagnosis Date  . Hypertension   . Hyperlipemia   . GERD (gastroesophageal reflux disease)     Past Surgical History  Procedure Laterality Date  . Hernia repair  2006    rt ing h  . Back surgery  2004    lumb lam  . Colonoscopy      No family history on file.  History  Substance Use Topics  . Smoking status: Never Smoker   . Smokeless tobacco: Not on file  . Alcohol Use: Yes      Review of Systems All other systems reviewed and are negative Allergies  Penicillins and Polymyxin b  Home Medications   Current Outpatient Rx  Name  Route  Sig  Dispense  Refill  . aspirin 81 MG chewable tablet   Oral   Chew 81 mg by mouth daily.           Marland Kitchen esomeprazole (NEXIUM) 40 MG capsule   Oral   Take 40 mg by mouth daily before breakfast.           . fenofibrate (TRICOR) 145 MG tablet   Oral   Take 145 mg by mouth daily.           Marland Kitchen ibuprofen (ADVIL,MOTRIN) 200 MG tablet   Oral   Take 200 mg by mouth every 6 (six) hours as needed.           . metoprolol succinate (TOPROL-XL) 25 MG 24 hr tablet   Oral   Take 25 mg by mouth daily.           Marland Kitchen  omega-3 acid ethyl esters (LOVAZA) 1 G capsule   Oral   Take 2 g by mouth 1 day or 1 dose.           . simvastatin (ZOCOR) 40 MG tablet   Oral   Take 40 mg by mouth every morning.           . valsartan-hydrochlorothiazide (DIOVAN-HCT) 160-12.5 MG per tablet   Oral   Take 1 tablet by mouth daily.             BP 137/84  Pulse 88  Temp(Src) 98.2 F (36.8 C) (Oral)  Resp 18  Ht 6\' 2"  (1.88 m)  Wt 228 lb (103.42 kg)  BMI 29.26 kg/m2  SpO2 99%  Physical Exam  Nursing note and vitals reviewed. Constitutional: He is oriented to person, place, and time. He appears well-developed and well-nourished.  No distress.  HENT:  Head: Normocephalic and atraumatic.  Eyes: Pupils are equal, round, and reactive to light.  Neck: Normal range of motion.  Cardiovascular: Normal rate and intact distal pulses.   Pulmonary/Chest: No respiratory distress.  Abdominal: Normal appearance. He exhibits no distension.  Musculoskeletal: Normal range of motion.  Superficial scratch on right wrist that has no signs of infection or redness.  Neurological: He is alert and oriented to person, place, and time. No cranial nerve deficit.  Skin: Skin is warm and dry. No rash noted.  Psychiatric: He has a normal mood and affect. His behavior is normal.    ED Course  Procedures (including critical care time)  Labs Reviewed - No data to display No results found.   1. Cat scratch, initial encounter       MDM  After some discussion with the patient he decided he did not want a tetanus injection her immunization.  I agree with this plan and do not think it is necessary.  I did encourage him to make sure the cat remains under his control for the next 7 days.  It's highly unlikely since the exposure to place about a month ago that there was any transmission of rabies from whatever injury the cat had.  The sighting of the fox is incidental and not known or proven exposure.       Nelia Shi,  MD 01/25/13 1114

## 2013-01-25 NOTE — ED Notes (Signed)
Pt reports he was possibly exposed to rabies approximately 20 days ago.  Reports family cat had blood under claws and has seen the cat with a fox.  Pt has a scratch on right hand that occurred 4 days ago.  Cat has not been vaccinated, however appears asymptomatic.

## 2013-01-25 NOTE — ED Notes (Signed)
MD at bedside. 

## 2013-01-27 ENCOUNTER — Encounter (HOSPITAL_BASED_OUTPATIENT_CLINIC_OR_DEPARTMENT_OTHER): Payer: Self-pay | Admitting: *Deleted

## 2013-01-27 ENCOUNTER — Emergency Department (HOSPITAL_BASED_OUTPATIENT_CLINIC_OR_DEPARTMENT_OTHER)
Admission: EM | Admit: 2013-01-27 | Discharge: 2013-01-27 | Disposition: A | Discharge status: Home / Self Care | Payer: 59 | Attending: Emergency Medicine | Admitting: Emergency Medicine

## 2013-01-27 DIAGNOSIS — Z7982 Long term (current) use of aspirin: Secondary | ICD-10-CM | POA: Insufficient documentation

## 2013-01-27 DIAGNOSIS — Y929 Unspecified place or not applicable: Secondary | ICD-10-CM | POA: Insufficient documentation

## 2013-01-27 DIAGNOSIS — K219 Gastro-esophageal reflux disease without esophagitis: Secondary | ICD-10-CM | POA: Insufficient documentation

## 2013-01-27 DIAGNOSIS — I10 Essential (primary) hypertension: Secondary | ICD-10-CM | POA: Insufficient documentation

## 2013-01-27 DIAGNOSIS — Z880 Allergy status to penicillin: Secondary | ICD-10-CM | POA: Insufficient documentation

## 2013-01-27 DIAGNOSIS — IMO0002 Reserved for concepts with insufficient information to code with codable children: Secondary | ICD-10-CM | POA: Insufficient documentation

## 2013-01-27 DIAGNOSIS — Y939 Activity, unspecified: Secondary | ICD-10-CM | POA: Insufficient documentation

## 2013-01-27 DIAGNOSIS — W64XXXA Exposure to other animate mechanical forces, initial encounter: Secondary | ICD-10-CM | POA: Insufficient documentation

## 2013-01-27 DIAGNOSIS — Z79899 Other long term (current) drug therapy: Secondary | ICD-10-CM | POA: Insufficient documentation

## 2013-01-27 DIAGNOSIS — Z23 Encounter for immunization: Secondary | ICD-10-CM | POA: Insufficient documentation

## 2013-01-27 DIAGNOSIS — E785 Hyperlipidemia, unspecified: Secondary | ICD-10-CM | POA: Insufficient documentation

## 2013-01-27 MED ORDER — RABIES VACCINE, PCEC IM SUSR
1.0000 mL | Freq: Once | INTRAMUSCULAR | Status: AC
  Administered 2013-01-27: 1 mL via INTRAMUSCULAR
  Filled 2013-01-27: qty 1

## 2013-01-27 MED ORDER — RABIES IMMUNE GLOBULIN 150 UNIT/ML IM INJ
INJECTION | INTRAMUSCULAR | Status: AC
  Filled 2013-01-27: qty 2

## 2013-01-27 MED ORDER — RABIES IMMUNE GLOBULIN 150 UNIT/ML IM INJ
20.0000 [IU]/kg | INJECTION | Freq: Once | INTRAMUSCULAR | Status: AC
  Administered 2013-01-27: 2060 [IU] via INTRAMUSCULAR

## 2013-01-27 MED ORDER — RABIES IMMUNE GLOBULIN 150 UNIT/ML IM INJ
INJECTION | INTRAMUSCULAR | Status: AC
  Administered 2013-01-27: 2060 [IU] via INTRAMUSCULAR
  Filled 2013-01-27: qty 12

## 2013-01-27 NOTE — ED Notes (Signed)
Seen here 6/21 for cat scratch wound on finger did not get rabies injection has decided he wants it today

## 2013-01-27 NOTE — ED Notes (Signed)
Pt has no symptoms- only here for rabies injections.

## 2013-01-27 NOTE — ED Provider Notes (Signed)
History     CSN: [id number]  Arrival date & time 01/27/13  1033   First MD Initiated Contact with Patient 01/27/13 1045      Chief Complaint  Patient presents with  . decided should have rabies vaccine     (Consider location/radiation/quality/duration/timing/severity/associated sxs/prior treatment) HPI Comments: Patient is a 62 year old male who presents today for a cat scratch over 3 weeks ago. His cat was possibly exposed to a fox approximately 3 weeks ago. He saw the fox in his yard and the cat had some broken nails. The cat has shown no signs of rabies infection and is behaving normally. The scratch on his right wrist is healing well in causing him no pain. He was evaluated by Dr. Radford Pax in this ED 2 days ago. At that time he denied the rabies vaccine and immunoglobulin because he was at low risk. He called his doctor today who recommended that he get the rabies vaccine and immunoglobulin as it is the recommendation of [facility]. [facility] is forcing him to euthanize his cat as he is not qualified to monitor the cat. He denies fevers, chills, nausea, vomiting, abdominal pain, headache, numbness, tingling, pain.   The history is provided by the patient. No language interpreter was used.    Past Medical History  Diagnosis Date  . Hypertension   . Hyperlipemia   . GERD (gastroesophageal reflux disease)     Past Surgical History  Procedure Laterality Date  . Hernia repair  2006    rt ing h  . Back surgery  2004    lumb lam  . Colonoscopy      History reviewed. No pertinent family history.  History  Substance Use Topics  . Smoking status: Never Smoker   . Smokeless tobacco: Not on file  . Alcohol Use: Yes      Review of Systems  Constitutional: Negative for fever and chills.  Eyes: Negative for visual disturbance.  Respiratory: Negative for shortness of breath.   Gastrointestinal: Negative for nausea, vomiting and abdominal pain.  Skin: Positive for  wound.  Neurological: Negative for headaches.  All other systems reviewed and are negative.    Allergies  Penicillins and Polymyxin b  Home Medications   Current Outpatient Rx  Name  Route  Sig  Dispense  Refill  . aspirin 81 MG chewable tablet   Oral   Chew 81 mg by mouth daily.           Marland Kitchen esomeprazole (NEXIUM) 40 MG capsule   Oral   Take 40 mg by mouth daily before breakfast.           . fenofibrate (TRICOR) 145 MG tablet   Oral   Take 145 mg by mouth daily.           Marland Kitchen ibuprofen (ADVIL,MOTRIN) 200 MG tablet   Oral   Take 200 mg by mouth every 6 (six) hours as needed.           . metoprolol succinate (TOPROL-XL) 25 MG 24 hr tablet   Oral   Take 25 mg by mouth daily.           Marland Kitchen omega-3 acid ethyl esters (LOVAZA) 1 G capsule   Oral   Take 2 g by mouth 1 day or 1 dose.           . simvastatin (ZOCOR) 40 MG tablet   Oral   Take 40 mg by mouth every morning.           Marland Kitchen  valsartan-hydrochlorothiazide (DIOVAN-HCT) 160-12.5 MG per tablet   Oral   Take 1 tablet by mouth daily.             BP 141/82  Pulse 62  Temp(Src) 98.4 F (36.9 C) (Oral)  Resp 18  SpO2 98%  Physical Exam  Nursing note and vitals reviewed. Constitutional: He is oriented to person, place, and time. He appears well-developed and well-nourished. No distress.  HENT:  Head: Normocephalic and atraumatic.  Right Ear: External ear normal.  Left Ear: External ear normal.  Nose: Nose normal.  Eyes: Conjunctivae are normal.  Neck: Normal range of motion. No tracheal deviation present.  Cardiovascular: Normal rate, regular rhythm and normal heart sounds.   Pulmonary/Chest: Effort normal and breath sounds normal. No stridor.  Abdominal: Soft. He exhibits no distension. There is no tenderness.  Musculoskeletal: Normal range of motion.  Neurological: He is alert and oriented to person, place, and time.  Skin: Skin is warm and dry. He is not diaphoretic.  Faint, superficial  well-healed scratch on right wrist  Psychiatric: He has a normal mood and affect. His behavior is normal.    ED Course  Procedures (including critical care time)  Labs Reviewed - No data to display No results found.   1. Cat scratch, subsequent encounter       MDM  Patient presents here to receive his rabies vaccine and immunoglobulin per the guidelines of [facility]. He is asymptomatic from a possible rabies exposure 3 weeks ago. Tetanus UTD. He was given information to follow up at the Urgent Care in [place]. Vital signs stable for discharge. Return instructions given. Patient / Family / Caregiver informed of clinical course, understand medical decision-making process, and agree with plan.         Mora Bellman, PA-C 01/27/13 1121

## 2013-01-28 NOTE — ED Provider Notes (Signed)
I have reviewed the report and personally reviewed the above radiology studies.    Zelia Yzaguirre S Alexys Lobello, MD 01/28/13 0840 

## 2013-01-30 ENCOUNTER — Encounter: Payer: Self-pay | Admitting: *Deleted

## 2013-01-30 ENCOUNTER — Emergency Department (INDEPENDENT_AMBULATORY_CARE_PROVIDER_SITE_OTHER)
Admission: EM | Admit: 2013-01-30 | Discharge: 2013-01-30 | Disposition: A | Discharge status: Home / Self Care | Payer: 59 | Source: Home / Self Care

## 2013-01-30 DIAGNOSIS — Z23 Encounter for immunization: Secondary | ICD-10-CM

## 2013-01-30 MED ORDER — RABIES VACCINE, PCEC IM SUSR
1.0000 mL | Freq: Once | INTRAMUSCULAR | Status: AC
  Administered 2013-01-30: 1 mL via INTRAMUSCULAR

## 2013-01-30 NOTE — ED Notes (Signed)
Patient here for 2nd rabies shot. Given with no difficulty.

## 2013-02-03 ENCOUNTER — Emergency Department (INDEPENDENT_AMBULATORY_CARE_PROVIDER_SITE_OTHER)
Admission: EM | Admit: 2013-02-03 | Discharge: 2013-02-03 | Disposition: A | Discharge status: Home / Self Care | Payer: 59 | Source: Home / Self Care

## 2013-02-03 ENCOUNTER — Encounter: Payer: Self-pay | Admitting: *Deleted

## 2013-02-03 DIAGNOSIS — Z23 Encounter for immunization: Secondary | ICD-10-CM

## 2013-02-03 MED ORDER — RABIES VACCINE, PCEC IM SUSR
1.0000 mL | Freq: Once | INTRAMUSCULAR | Status: AC
  Administered 2013-02-03: 1 mL via INTRAMUSCULAR

## 2013-02-10 ENCOUNTER — Emergency Department
Admission: EM | Admit: 2013-02-10 | Discharge: 2013-02-10 | Disposition: A | Discharge status: Home / Self Care | Payer: 59 | Source: Home / Self Care

## 2013-02-10 ENCOUNTER — Encounter: Payer: Self-pay | Admitting: *Deleted

## 2013-02-10 DIAGNOSIS — Z203 Contact with and (suspected) exposure to rabies: Secondary | ICD-10-CM

## 2013-02-10 MED ORDER — RABIES VACCINE, PCEC IM SUSR
1.0000 mL | Freq: Once | INTRAMUSCULAR | Status: AC
  Administered 2013-02-10: 1 mL via INTRAMUSCULAR

## 2013-02-10 NOTE — ED Notes (Signed)
The pt is here today for his last rabies vaccine.

## 2014-01-09 HISTORY — PX: PROSTATE BIOPSY: SHX241

## 2014-01-13 DIAGNOSIS — C61 Malignant neoplasm of prostate: Secondary | ICD-10-CM

## 2014-01-13 HISTORY — DX: Malignant neoplasm of prostate: C61

## 2014-01-15 ENCOUNTER — Other Ambulatory Visit (HOSPITAL_COMMUNITY): Payer: Self-pay | Admitting: Urology

## 2014-01-15 DIAGNOSIS — C61 Malignant neoplasm of prostate: Secondary | ICD-10-CM

## 2014-01-23 ENCOUNTER — Encounter (HOSPITAL_COMMUNITY)
Admission: RE | Admit: 2014-01-23 | Discharge: 2014-01-23 | Disposition: A | Discharge status: Home / Self Care | Payer: 59 | Source: Ambulatory Visit | Attending: Urology | Admitting: Urology

## 2014-01-23 DIAGNOSIS — C61 Malignant neoplasm of prostate: Secondary | ICD-10-CM | POA: Insufficient documentation

## 2014-01-23 MED ORDER — TECHNETIUM TC 99M MEDRONATE IV KIT: 26.5000 | PACK | Freq: Once | INTRAVENOUS | Status: DC | PRN

## 2014-01-28 ENCOUNTER — Ambulatory Visit (HOSPITAL_COMMUNITY): Payer: 59

## 2014-01-28 ENCOUNTER — Encounter (HOSPITAL_COMMUNITY): Payer: 59

## 2014-02-04 ENCOUNTER — Encounter: Payer: Self-pay | Admitting: *Deleted

## 2014-02-04 NOTE — Progress Notes (Signed)
GU Location of Tumor / Histology: prostate adenocarcinoma  If Prostate Cancer, Gleason Score is (4 + 4) and PSA is (5.920 on 10/12/13)  [name] presented 2 months ago with signs/symptoms of: elevated PSA  Biopsies of prostate (if applicable) revealed:  0/9/81   Past/Anticipated interventions by urology, if any: biopsy  Past/Anticipated interventions by medical oncology, if any: none  Weight changes, if any: no  Bowel/Bladder complaints, if any:   Nausea/Vomiting, if any: no  Pain issues, if any:  no  SAFETY ISSUES:  Prior radiation? no  Pacemaker/ICD? no  Possible current pregnancy? na  Is the patient on methotrexate? no  Current Complaints / other details:  Married, Chief Financial Officer Dr Alinda Money recommends either primary surgical therapy or primary xrt therapy w/androgen deprivation therapy.  Patient is here with his wife. Patient's IPSS score is 0.  He reports that he is not having any symptoms and says if his PSA wasn't elevated he would not know he has cancer.

## 2014-02-11 ENCOUNTER — Ambulatory Visit
Admission: RE | Admit: 2014-02-11 | Discharge: 2014-02-11 | Disposition: A | Discharge status: Home / Self Care | Payer: 59 | Source: Ambulatory Visit | Attending: Radiation Oncology | Admitting: Radiation Oncology

## 2014-02-11 ENCOUNTER — Encounter: Payer: Self-pay | Admitting: Radiation Oncology

## 2014-02-11 VITALS — BP 139/75 | HR 72 | Temp 97.9°F

## 2014-02-11 VITALS — BP 139/75 | HR 72 | Temp 97.9°F | Ht 74.0 in | Wt 227.8 lb

## 2014-02-11 DIAGNOSIS — I10 Essential (primary) hypertension: Secondary | ICD-10-CM | POA: Insufficient documentation

## 2014-02-11 DIAGNOSIS — Z510 Encounter for antineoplastic radiation therapy: Secondary | ICD-10-CM | POA: Diagnosis present

## 2014-02-11 DIAGNOSIS — C61 Malignant neoplasm of prostate: Secondary | ICD-10-CM | POA: Diagnosis not present

## 2014-02-11 DIAGNOSIS — E785 Hyperlipidemia, unspecified: Secondary | ICD-10-CM | POA: Insufficient documentation

## 2014-02-11 DIAGNOSIS — K219 Gastro-esophageal reflux disease without esophagitis: Secondary | ICD-10-CM | POA: Diagnosis not present

## 2014-02-11 DIAGNOSIS — N529 Male erectile dysfunction, unspecified: Secondary | ICD-10-CM | POA: Diagnosis not present

## 2014-02-11 DIAGNOSIS — Z79899 Other long term (current) drug therapy: Secondary | ICD-10-CM | POA: Diagnosis not present

## 2014-02-11 DIAGNOSIS — Z7982 Long term (current) use of aspirin: Secondary | ICD-10-CM | POA: Insufficient documentation

## 2014-02-11 HISTORY — DX: Ventricular premature depolarization: I49.3

## 2014-02-11 HISTORY — DX: Supraventricular tachycardia, unspecified: I47.10

## 2014-02-11 HISTORY — DX: Malignant neoplasm of prostate: C61

## 2014-02-11 HISTORY — DX: Supraventricular tachycardia: I47.1

## 2014-02-11 HISTORY — DX: Acute embolism and thrombosis of unspecified deep veins of unspecified lower extremity: I82.409

## 2014-02-11 NOTE — Progress Notes (Signed)
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 male who is seen today for the courtesy of Dr. Alinda Klein for evaluation of his stage TI C. high-risk adenocarcinoma prostate. His baseline PSA was around 2.9. His PSA in September 2014 increased to 4.01 and then increased further to 6.7 in the spring of 2015. A repeat PSA on 10/10/2013 decreased to 5.92. He underwent ultrasound-guided biopsies with Dr. Alinda Klein on 01/09/2014. His was found to have Gleason 8 (4+4) involving 10% of one core from the right apex. He had Gleason 7 (4+3) involving 60% of one core from the left lateral base and 60% of one core from the left base. He had Gleason 7 (3+4) involving 5% of one core from the left lateral mid gland and 20% of one core from left mid gland. Gleason 6 was seen to involve 5% of one core from the left lateral apex. His gland volume was 34.2 cc. He is doing well from a GU and GI standpoint. His I PSS score is 0. He does have erectile dysfunction. A staging CT scan on 02/02/2014 was without evidence for metastatic disease. A bone scan on 01/23/2014 was without evidence for metastatic disease.  PREVIOUS RADIATION THERAPY: No   PAST MEDICAL HISTORY:  has a past medical history of Hypertension; Hyperlipemia; GERD (gastroesophageal reflux disease); Prostate cancer (01/13/14); DVT of lower extremity (deep venous thrombosis); Paroxysmal SVT (supraventricular tachycardia); and Frequent PVCs.     PAST SURGICAL HISTORY:  Past Surgical History  Procedure Laterality Date  . Hernia repair  2006    rt ing h  . Back surgery  2004    lumb lam  . Colonoscopy    . Knee surgery Left       FAMILY HISTORY: family history includes Colon cancer in his maternal grandmother; Heart attack in his father; Heart disease in his father. His father died following a CABG at age 31. His mother is alive at 25, and lives independently. She does have mild dementia. No family history of prostate cancer.   SOCIAL HISTORY:  reports that he has never smoked. He does not have any smokeless tobacco history on file. He reports that he drinks alcohol. He reports that he does not use illicit drugs. Married, no children. 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 aspirin 81 MG chewable tablet Chew 81 mg by mouth daily.        Marland Kitchen esomeprazole (NEXIUM) 40 MG capsule Take 40 mg by mouth daily before breakfast.        . fenofibrate (TRICOR) 145 MG tablet Take 145 mg by mouth daily.        Marland Kitchen ibuprofen (ADVIL,MOTRIN) 200 MG tablet Take 200 mg by mouth every 6 (six) hours as needed.        . metoprolol succinate (TOPROL-XL) 25 MG 24 hr tablet Take 25 mg by mouth daily.        Marland Kitchen omega-3 acid ethyl esters (LOVAZA) 1 G capsule Take 2 g by mouth  1 day or 1 dose.        . simvastatin (ZOCOR) 40 MG tablet Take 40 mg by mouth every morning.        . valsartan-hydrochlorothiazide (DIOVAN-HCT) 160-12.5 MG per tablet Take 1 tablet by mouth daily.         No current facility-administered medications for this encounter.     REVIEW OF SYSTEMS:  Pertinent items are noted in HPI.    PHYSICAL EXAM:  height is 6\' 2"  (1.88 m) and weight is 227 lb 12.8 oz (103.329 kg). His temperature is 97.9 F (36.6 C). His blood pressure is 139/75 and his pulse is 72.   Alert and oriented. Head and neck examination: Grossly unremarkable. Nodes: Without palpable cervical or supraclavicular adenopathy. Chest: Lungs clear. Abdomen: Without masses organomegaly. Genitalia: Unremarkable to inspection. Rectal: The  prostate gland is normal in size and is without focal induration or nodularity. Extremities: Without edema.   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 I explained to the patient and his wife that his prognosis is related to his stage, PSA level, and Gleason score. His stage and PSA level are favorable while his Gleason score of 8 is unfavorable. We discussed surgery versus radiation therapy. Radiation therapy options include 5 weeks of external beam followed by a seed implant boost or 8 weeks of external beam/IMRT. With both radiation therapy options I would recommend 2 years of androgen deprivation therapy. We discussed the potential acute and late toxicities of radiation therapy. We also discussed the side effects of androgen deprivation therapy. We talked about the possibility of needing postoperative radiation therapy following a robotic prostatectomy. At this point in time he is leaning toward surgery which I think is certainly reasonable choice. He was given literature for review.   PLAN: As discussed above.  I spent 60 minutes minutes face to face with the patient and more than 50% of that time was spent in counseling and/or coordination of care.

## 2014-02-11 NOTE — Progress Notes (Signed)
Please see the Nurse Progress Note in the MD Initial Consult Encounter for this patient. 

## 2014-02-17 ENCOUNTER — Other Ambulatory Visit: Payer: Self-pay | Admitting: Urology

## 2014-03-05 ENCOUNTER — Encounter (HOSPITAL_COMMUNITY): Payer: Self-pay | Admitting: Pharmacy Technician

## 2014-03-06 ENCOUNTER — Encounter (HOSPITAL_COMMUNITY): Payer: Self-pay

## 2014-03-06 ENCOUNTER — Encounter (INDEPENDENT_AMBULATORY_CARE_PROVIDER_SITE_OTHER): Payer: Self-pay

## 2014-03-06 ENCOUNTER — Encounter (HOSPITAL_COMMUNITY)
Admission: RE | Admit: 2014-03-06 | Discharge: 2014-03-06 | Disposition: A | Discharge status: Home / Self Care | Payer: 59 | Source: Ambulatory Visit | Attending: Urology | Admitting: Urology

## 2014-03-06 ENCOUNTER — Ambulatory Visit (HOSPITAL_COMMUNITY)
Admission: RE | Admit: 2014-03-06 | Discharge: 2014-03-06 | Disposition: A | Discharge status: Home / Self Care | Payer: 59 | Source: Ambulatory Visit | Attending: Anesthesiology | Admitting: Anesthesiology

## 2014-03-06 DIAGNOSIS — Z01818 Encounter for other preprocedural examination: Secondary | ICD-10-CM | POA: Diagnosis present

## 2014-03-06 DIAGNOSIS — I10 Essential (primary) hypertension: Secondary | ICD-10-CM | POA: Diagnosis not present

## 2014-03-06 HISTORY — DX: Cardiac arrhythmia, unspecified: I49.9

## 2014-03-06 HISTORY — DX: Personal history of other diseases of the digestive system: Z87.19

## 2014-03-06 LAB — CBC
HEMATOCRIT: 42.1 % (ref 39.0–52.0)
Hemoglobin: 14.1 g/dL (ref 13.0–17.0)
MCH: 28.1 pg (ref 26.0–34.0)
MCHC: 33.5 g/dL (ref 30.0–36.0)
MCV: 83.9 fL (ref 78.0–100.0)
PLATELETS: 257 10*3/uL (ref 150–400)
RBC: 5.02 MIL/uL (ref 4.22–5.81)
RDW: 12.9 % (ref 11.5–15.5)
WBC: 8.4 10*3/uL (ref 4.0–10.5)

## 2014-03-06 LAB — BASIC METABOLIC PANEL
ANION GAP: 14 (ref 5–15)
BUN: 20 mg/dL (ref 6–23)
CO2: 26 mEq/L (ref 19–32)
CREATININE: 1.19 mg/dL (ref 0.50–1.35)
Calcium: 10.1 mg/dL (ref 8.4–10.5)
Chloride: 98 mEq/L (ref 96–112)
GFR, EST AFRICAN AMERICAN: 73 mL/min — AB (ref >90)
GFR, EST NON AFRICAN AMERICAN: 63 mL/min — AB (ref >90)
Glucose, Bld: 92 mg/dL (ref 70–99)
Potassium: 4.2 mEq/L (ref 3.7–5.3)
Sodium: 138 mEq/L (ref 137–147)

## 2014-03-06 NOTE — Patient Instructions (Signed)
FOLLOW YOUR BOWEL PREP INSTRUCTIONS FROM DR. Sheldon.  YOUR SURGERY IS SCHEDULED AT [facility]  ON:   Thursday  8/13  REPORT TO  SHORT STAY CENTER AT:  5:15 AM   PLEASE COME IN THE [place] ENTRANCE AND FOLLOW SIGNS TO SHORT STAY CENTER.  DO NOT EAT OR DRINK ANYTHING AFTER MIDNIGHT THE NIGHT BEFORE YOUR SURGERY.  YOU MAY BRUSH YOUR TEETH, RINSE OUT YOUR MOUTH--BUT NO WATER, NO FOOD, NO CHEWING GUM, NO MINTS, NO CANDIES, NO CHEWING TOBACCO.  PLEASE TAKE THE FOLLOWING MEDICATIONS THE AM OF YOUR SURGERY WITH A FEW SIPS OF WATER:  NEXIUM, METOPROLOL, SIMVASTATIN.  I DO NOT BRING VALUABLES, MONEY, CREDIT CARDS.  DO NOT WEAR JEWELRY, MAKE-UP, NAIL POLISH AND NO METAL PINS OR CLIPS IN YOUR HAIR. CONTACT LENS, DENTURES / PARTIALS, GLASSES SHOULD NOT BE WORN TO SURGERY AND IN MOST CASES-HEARING AIDS WILL NEED TO BE REMOVED.  BRING YOUR GLASSES CASE, ANY EQUIPMENT NEEDED FOR YOUR CONTACT LENS. FOR PATIENTS ADMITTED TO THE HOSPITAL--CHECK OUT TIME THE DAY OF DISCHARGE IS 11:00 AM.  ALL INPATIENT ROOMS ARE PRIVATE - WITH BATHROOM, TELEPHONE, TELEVISION AND WIFI INTERNET.                                                    PLEASE READ OVER ANY  FACT SHEETS THAT YOU WERE GIVEN: MRSA INFORMATION, BLOOD TRANSFUSION INFORMATION, INCENTIVE SPIROMETER INFORMATION.  PLEASE BE AWARE THAT YOU MAY NEED ADDITIONAL BLOOD DRAWN DAY OF YOUR SURGERY  _______________________________________________________________________   [facility] - Preparing for Surgery Before surgery, you can play an important role.  Because skin is not sterile, your skin needs to be as free of germs as possible.  You can reduce the number of germs on your skin by washing with CHG (chlorahexidine gluconate) soap before surgery.  CHG is an antiseptic cleaner which kills germs and bonds with the skin to continue killing germs even after washing. Please DO NOT use if you have an allergy to  CHG or antibacterial soaps.  If your skin becomes reddened/irritated stop using the CHG and inform your nurse when you arrive at Short Stay. Do not shave (including legs and underarms) for at least 48 hours prior to the first CHG shower.  You may shave your face/neck. Please follow these instructions carefully:  1.  Shower with CHG Soap the night before surgery and the  morning of Surgery.  2.  If you choose to wash your hair, wash your hair first as usual with your  normal  shampoo.  3.  After you shampoo, rinse your hair and body thoroughly to remove the  shampoo.                           4.  Use CHG as you would any other liquid soap.  You can apply chg directly  to the skin and wash                       Gently with a scrungie or clean washcloth.  5.  Apply the CHG Soap to your body ONLY FROM THE NECK DOWN.   Do not use on face/ open  Wound or open sores. Avoid contact with eyes, ears mouth and genitals (private parts).                       Wash face,  Genitals (private parts) with your normal soap.             6.  Wash thoroughly, paying special attention to the area where your surgery  will be performed.  7.  Thoroughly rinse your body with warm water from the neck down.  8.  DO NOT shower/wash with your normal soap after using and rinsing off  the CHG Soap.                9.  Pat yourself dry with a clean towel.            10.  Wear clean pajamas.            11.  Place clean sheets on your bed the night of your first shower and do not  sleep with pets. Day of Surgery : Do not apply any lotions/deodorants the morning of surgery.  Please wear clean clothes to the hospital/surgery center.  FAILURE TO FOLLOW THESE INSTRUCTIONS MAY RESULT IN THE CANCELLATION OF YOUR SURGERY PATIENT SIGNATURE_________________________________  NURSE SIGNATURE__________________________________  ________________________________________________________________________   [name]  An incentive spirometer is a tool that can help keep your lungs clear and active. This tool measures how well you are filling your lungs with each breath. Taking long deep breaths may help reverse or decrease the chance of developing breathing (pulmonary) problems (especially infection) following:  A long period of time when you are unable to move or be active. BEFORE THE PROCEDURE   If the spirometer includes an indicator to show your best effort, your nurse or respiratory therapist will set it to a desired goal.  If possible, sit up straight or lean slightly forward. Try not to slouch.  Hold the incentive spirometer in an upright position. INSTRUCTIONS FOR USE  1. Sit on the edge of your bed if possible, or sit up as far as you can in bed or on a chair. 2. Hold the incentive spirometer in an upright position. 3. Breathe out normally. 4. Place the mouthpiece in your mouth and seal your lips tightly around it. 5. Breathe in slowly and as deeply as possible, raising the piston or the ball toward the top of the column. 6. Hold your breath for 3-5 seconds or for as long as possible. Allow the piston or ball to fall to the bottom of the column. 7. Remove the mouthpiece from your mouth and breathe out normally. 8. Rest for a few seconds and repeat Steps 1 through 7 at least 10 times every 1-2 hours when you are awake. Take your time and take a few normal breaths between deep breaths. 9. The spirometer may include an indicator to show your best effort. Use the indicator as a goal to work toward during each repetition. 10. After each set of 10 deep breaths, practice coughing to be sure your lungs are clear. If you have an incision (the cut made at the time of surgery), support your incision when coughing by placing a pillow or rolled up towels firmly against it. Once you are able to get out of bed, walk around indoors and cough well. You may stop using the incentive spirometer when  instructed by your caregiver.  RISKS AND COMPLICATIONS  Take your time so you do not  get dizzy or light-headed.  If you are in pain, you may need to take or ask for pain medication before doing incentive spirometry. It is harder to take a deep breath if you are having pain. AFTER USE  Rest and breathe slowly and easily.  It can be helpful to keep track of a log of your progress. Your caregiver can provide you with a simple table to help with this. If you are using the spirometer at home, follow these instructions: [place] IF:   You are having difficultly using the spirometer.  You have trouble using the spirometer as often as instructed.  Your pain medication is not giving enough relief while using the spirometer.  You develop fever of 100.5 F (38.1 C) or higher. SEEK IMMEDIATE MEDICAL CARE IF:   You cough up bloody sputum that had not been present before.  You develop fever of 102 F (38.9 C) or greater.  You develop worsening pain at or near the incision site. MAKE SURE YOU:   Understand these instructions.  Will watch your condition.  Will get help right away if you are not doing well or get worse. Document Released: 12/04/2006 Document Revised: 10/16/2011 Document Reviewed: 02/04/2007 ExitCare Patient Information 2014 Exit[employer], [place].   ________________________________________________________________________  WHAT IS A BLOOD TRANSFUSION? Blood Transfusion Information  A transfusion is the replacement of blood or some of its parts. Blood is made up of multiple cells which provide different functions.  Red blood cells carry oxygen and are used for blood loss replacement.  White blood cells fight against infection.  Platelets control bleeding.  Plasma helps clot blood.  Other blood products are available for specialized needs, such as hemophilia or other clotting disorders. BEFORE THE TRANSFUSION  Who gives blood for transfusions?   Healthy  volunteers who are fully evaluated to make sure their blood is safe. This is blood bank blood. Transfusion therapy is the safest it has ever been in the practice of medicine. Before blood is taken from a donor, a complete history is taken to make sure that person has no history of diseases nor engages in risky social behavior (examples are intravenous drug use or sexual activity with multiple partners). The donor's travel history is screened to minimize risk of transmitting infections, such as malaria. The donated blood is tested for signs of infectious diseases, such as HIV and hepatitis. The blood is then tested to be sure it is compatible with you in order to minimize the chance of a transfusion reaction. If you or a relative donates blood, this is often done in anticipation of surgery and is not appropriate for emergency situations. It takes many days to process the donated blood. RISKS AND COMPLICATIONS Although transfusion therapy is very safe and saves many lives, the main dangers of transfusion include:   Getting an infectious disease.  Developing a transfusion reaction. This is an allergic reaction to something in the blood you were given. Every precaution is taken to prevent this. The decision to have a blood transfusion has been considered carefully by your caregiver before blood is given. Blood is not given unless the benefits outweigh the risks. AFTER THE TRANSFUSION  Right after receiving a blood transfusion, you will usually feel much better and more energetic. This is especially true if your red blood cells have gotten low (anemic). The transfusion raises the level of the red blood cells which carry oxygen, and this usually causes an energy increase.  The nurse administering the transfusion will  monitor you carefully for complications. HOME CARE INSTRUCTIONS  No special instructions are needed after a transfusion. You may find your energy is better. Speak with your caregiver about any  limitations on activity for underlying diseases you may have. SEEK MEDICAL CARE IF:   Your condition is not improving after your transfusion.  You develop redness or irritation at the intravenous (IV) site. SEEK IMMEDIATE MEDICAL CARE IF:  Any of the following symptoms occur over the next 12 hours:  Shaking chills.  You have a temperature by mouth above 102 F (38.9 C), not controlled by medicine.  Chest, back, or muscle pain.  People around you feel you are not acting correctly or are confused.  Shortness of breath or difficulty breathing.  Dizziness and fainting.  You get a rash or develop hives.  You have a decrease in urine output.  Your urine turns a dark color or changes to pink, red, or brown. Any of the following symptoms occur over the next 10 days:  You have a temperature by mouth above 102 F (38.9 C), not controlled by medicine.  Shortness of breath.  Weakness after normal activity.  The [place] part of the eye turns yellow (jaundice).  You have a decrease in the amount of urine or are urinating less often.  Your urine turns a dark color or changes to pink, red, or brown. Document Released: 07/21/2000 Document Revised: 10/16/2011 Document Reviewed: 03/09/2008 [facility] Patient Information 2014 [place], [place].  _______________________________________________________________________

## 2014-03-06 NOTE — Progress Notes (Signed)
03/06/14 1330  OBSTRUCTIVE SLEEP APNEA  Have you ever been diagnosed with sleep apnea through a sleep study? No  Do you snore loudly (loud enough to be heard through closed doors)?  0  Do you often feel tired, fatigued, or sleepy during the daytime? 0  Has anyone observed you stop breathing during your sleep? 0  Do you have, or are you being treated for high blood pressure? 1  BMI more than 35 kg/m2? 0  Age over 64 years old? 1  Neck circumference greater than 40 cm/16 inches? 1  Gender: 1  Obstructive Sleep Apnea Score 4  Score 4 or greater  Results sent to PCP

## 2014-03-06 NOTE — Pre-Procedure Instructions (Signed)
EKG AND CXR WERE DONE TODAY - PREOP AT [facility]. 

## 2014-03-18 MED ORDER — VANCOMYCIN HCL 10 G IV SOLR
1500.0000 mg | INTRAVENOUS | Status: AC
  Administered 2014-03-19: 1500 mg via INTRAVENOUS
  Filled 2014-03-18: qty 1500

## 2014-03-18 NOTE — H&P (Signed)
  History of Present Illness [name] is a 63 year old gentleman who was found to have a persistently elevated PSA of 5.92 prompting urologic consultation. He underwent a prostate needle biopsy on 01/09/14 which confirmed Gleason 4+4=8 adenocarcinoma of the prostate with 6 out of 12 biopsy cores positive for malignancy. He has no family history of prostate cancer.  TNM stage: cT1c Nx Mx PSA: 5.92 Gleason score: 4+4=8 Biopsy (01/09/14): 6/12 cores positive   Left: L lateral apex (5%, 3+3=6), L lateral mid (5%, 3+4=7), L mid (20%, 3+4=7), L lateral base (60%, 4+3=7), L base (60%, 4+3=7)   Right: R apex (10%, 4+4=8) Prostate volume: 34.2 cc  Nomogram OC disease: 32% EPE: 67% LNI: 23% SVI: 26% PFS (surgery): 44% at 5 years, 29% at 10 years  Urinary function: IPSS 1 Erectile function: He does have erectile dysfunction. SHIM score is 8.   Past Medical History Problems  1. History of DVT of lower extremity (deep venous thrombosis) (453.40) 2. History of esophageal reflux (V12.79) 3. History of hyperlipidemia (V12.29) 4. History of hypertension (V12.59) 5. History of paroxysmal supraventricular tachycardia (V12.59)  Surgical History Problems  1. History of Back Surgery 2. History of Knee Surgery Left 3. History of Laparoscopy Repair Of Initial Inguinal Hernia  Current Meds 1. Aspirin 325 MG Oral Tablet;  Therapy: (Recorded:22Apr2015) to Recorded 2. Diovan HCT 160-12.5 MG Oral Tablet;  Therapy: (Recorded:22Apr2015) to Recorded 3. Lovaza 1 GM Oral Capsule;  Therapy: (Recorded:22Apr2015) to Recorded 4. NexIUM 40 MG Oral Capsule Delayed Release;  Therapy: (Recorded:22Apr2015) to Recorded 5. Toprol XL 50 MG Oral Tablet Extended Release 24 Hour;  Therapy: (Recorded:22Apr2015) to Recorded 6. Tricor 145 MG Oral Tablet;  Therapy: (Recorded:22Apr2015) to Recorded 7. Zocor 40 MG Oral Tablet;  Therapy: (Recorded:22Apr2015) to Recorded  Allergies Medication  1. Penicillins  Family  History Problems  1. Family history of cardiac disorder (V17.49) : Father 2. Family history of myocardial infarction (V17.3) : Father  Social History Problems  1. Denied: History of Alcohol use 2. Married 3. Never smoker 4. Occupation   IT consultant  Height: 6 ft 2 in Weight: 228 lb  BMI Calculated: 29.27 BSA Calculated: 2.3   Physical Exam Constitutional: Well nourished and well developed . No acute distress.  ENT:. The ears and nose are normal in appearance.  Neck: The appearance of the neck is normal and no neck mass is present.  Pulmonary: No respiratory distress and normal respiratory rhythm and effort.  Cardiovascular: Heart rate and rhythm are normal . No peripheral edema.  Skin: Normal skin turgor, no visible rash and no visible skin lesions.  Neuro/Psych:. Mood and affect are appropriate.     Assessment Assessed  1. Prostate cancer (185)    Discussion/Summary 1. Prostate cancer: [name] has elected to proceed with surgical therapy for his prostate cancer and will undergo a RAL radical prostatectomy and BPLND.

## 2014-03-19 ENCOUNTER — Encounter (HOSPITAL_COMMUNITY): Payer: Self-pay | Admitting: *Deleted

## 2014-03-19 ENCOUNTER — Inpatient Hospital Stay (HOSPITAL_COMMUNITY): Payer: 59 | Admitting: Anesthesiology

## 2014-03-19 ENCOUNTER — Encounter (HOSPITAL_COMMUNITY)
Admission: RE | Disposition: A | Discharge status: Home / Self Care | Payer: Self-pay | Source: Ambulatory Visit | Attending: Urology

## 2014-03-19 ENCOUNTER — Inpatient Hospital Stay (HOSPITAL_COMMUNITY)
Admission: RE | Admit: 2014-03-19 | Discharge: 2014-03-20 | DRG: 708 | Disposition: A | Discharge status: Home / Self Care | Payer: 59 | Source: Ambulatory Visit | Attending: Urology | Admitting: Urology

## 2014-03-19 ENCOUNTER — Encounter (HOSPITAL_COMMUNITY): Payer: 59 | Admitting: Anesthesiology

## 2014-03-19 DIAGNOSIS — C61 Malignant neoplasm of prostate: Secondary | ICD-10-CM | POA: Diagnosis present

## 2014-03-19 DIAGNOSIS — K219 Gastro-esophageal reflux disease without esophagitis: Secondary | ICD-10-CM | POA: Diagnosis present

## 2014-03-19 DIAGNOSIS — I10 Essential (primary) hypertension: Secondary | ICD-10-CM | POA: Diagnosis present

## 2014-03-19 DIAGNOSIS — N529 Male erectile dysfunction, unspecified: Secondary | ICD-10-CM | POA: Diagnosis present

## 2014-03-19 DIAGNOSIS — Z880 Allergy status to penicillin: Secondary | ICD-10-CM

## 2014-03-19 DIAGNOSIS — Z8249 Family history of ischemic heart disease and other diseases of the circulatory system: Secondary | ICD-10-CM

## 2014-03-19 DIAGNOSIS — E785 Hyperlipidemia, unspecified: Secondary | ICD-10-CM | POA: Diagnosis present

## 2014-03-19 DIAGNOSIS — Z79899 Other long term (current) drug therapy: Secondary | ICD-10-CM | POA: Diagnosis not present

## 2014-03-19 DIAGNOSIS — Z7982 Long term (current) use of aspirin: Secondary | ICD-10-CM | POA: Diagnosis not present

## 2014-03-19 DIAGNOSIS — Z86718 Personal history of other venous thrombosis and embolism: Secondary | ICD-10-CM

## 2014-03-19 HISTORY — PX: LYMPHADENECTOMY: SHX5960

## 2014-03-19 HISTORY — PX: ROBOT ASSISTED LAPAROSCOPIC RADICAL PROSTATECTOMY: SHX5141

## 2014-03-19 LAB — TYPE AND SCREEN
ABO/RH(D): B POS
ANTIBODY SCREEN: NEGATIVE

## 2014-03-19 LAB — ABO/RH: ABO/RH(D): B POS

## 2014-03-19 LAB — HEMOGLOBIN AND HEMATOCRIT, BLOOD
HCT: 39.7 % (ref 39.0–52.0)
Hemoglobin: 13.5 g/dL (ref 13.0–17.0)

## 2014-03-19 SURGERY — ROBOTIC ASSISTED LAPAROSCOPIC RADICAL PROSTATECTOMY LEVEL 3
Anesthesia: General

## 2014-03-19 MED ORDER — HYDROMORPHONE HCL PF 1 MG/ML IJ SOLN
INTRAMUSCULAR | Status: AC
  Filled 2014-03-19: qty 1

## 2014-03-19 MED ORDER — ROCURONIUM BROMIDE 100 MG/10ML IV SOLN
INTRAVENOUS | Status: DC | PRN
  Administered 2014-03-19 (×3): 10 mg via INTRAVENOUS
  Administered 2014-03-19: 20 mg via INTRAVENOUS
  Administered 2014-03-19: 50 mg via INTRAVENOUS

## 2014-03-19 MED ORDER — LIDOCAINE HCL (CARDIAC) 20 MG/ML IV SOLN
INTRAVENOUS | Status: AC
Start: 2014-03-19 — End: 2014-03-19
  Filled 2014-03-19: qty 5

## 2014-03-19 MED ORDER — GLYCOPYRROLATE 0.2 MG/ML IJ SOLN
INTRAMUSCULAR | Status: AC
  Filled 2014-03-19: qty 4

## 2014-03-19 MED ORDER — MIDAZOLAM HCL 2 MG/2ML IJ SOLN
INTRAMUSCULAR | Status: AC
  Filled 2014-03-19: qty 2

## 2014-03-19 MED ORDER — GLYCOPYRROLATE 0.2 MG/ML IJ SOLN
INTRAMUSCULAR | Status: DC | PRN
  Administered 2014-03-19: .8 mg via INTRAVENOUS

## 2014-03-19 MED ORDER — PROPOFOL 10 MG/ML IV BOLUS
INTRAVENOUS | Status: AC
  Filled 2014-03-19: qty 20

## 2014-03-19 MED ORDER — ROCURONIUM BROMIDE 100 MG/10ML IV SOLN
INTRAVENOUS | Status: AC
  Filled 2014-03-19: qty 1

## 2014-03-19 MED ORDER — FENTANYL CITRATE 0.05 MG/ML IJ SOLN
INTRAMUSCULAR | Status: AC
  Filled 2014-03-19: qty 5

## 2014-03-19 MED ORDER — HEPARIN SODIUM (PORCINE) 1000 UNIT/ML IJ SOLN
INTRAMUSCULAR | Status: AC
  Filled 2014-03-19: qty 1

## 2014-03-19 MED ORDER — DEXTROSE-NACL 5-0.45 % IV SOLN
INTRAVENOUS | Status: DC
  Administered 2014-03-19 – 2014-03-20 (×3): via INTRAVENOUS

## 2014-03-19 MED ORDER — LACTATED RINGERS IV SOLN
INTRAVENOUS | Status: DC | PRN
  Administered 2014-03-19 (×2): via INTRAVENOUS

## 2014-03-19 MED ORDER — HYDROCHLOROTHIAZIDE 12.5 MG PO CAPS
12.5000 mg | ORAL_CAPSULE | Freq: Every day | ORAL | Status: DC
  Administered 2014-03-20: 12.5 mg via ORAL
  Filled 2014-03-19 (×2): qty 1

## 2014-03-19 MED ORDER — MIDAZOLAM HCL 5 MG/5ML IJ SOLN
INTRAMUSCULAR | Status: DC | PRN
  Administered 2014-03-19: 2 mg via INTRAVENOUS

## 2014-03-19 MED ORDER — MEPERIDINE HCL 50 MG/ML IJ SOLN: 6.2500 mg | INTRAMUSCULAR | Status: DC | PRN

## 2014-03-19 MED ORDER — KETOROLAC TROMETHAMINE 15 MG/ML IJ SOLN
15.0000 mg | Freq: Four times a day (QID) | INTRAMUSCULAR | Status: DC
  Administered 2014-03-19 – 2014-03-20 (×4): 15 mg via INTRAVENOUS
  Filled 2014-03-19 (×6): qty 1

## 2014-03-19 MED ORDER — CIPROFLOXACIN HCL 500 MG PO TABS: 500.0000 mg | ORAL_TABLET | Freq: Two times a day (BID) | ORAL | Status: DC

## 2014-03-19 MED ORDER — LIDOCAINE HCL (CARDIAC) 20 MG/ML IV SOLN
INTRAVENOUS | Status: DC | PRN
  Administered 2014-03-19: 50 mg via INTRAVENOUS

## 2014-03-19 MED ORDER — HYDROMORPHONE HCL PF 1 MG/ML IJ SOLN
0.2500 mg | INTRAMUSCULAR | Status: DC | PRN
  Administered 2014-03-19 (×4): 0.5 mg via INTRAVENOUS

## 2014-03-19 MED ORDER — IBUPROFEN 200 MG PO TABS: 400.0000 mg | ORAL_TABLET | Freq: Four times a day (QID) | ORAL | Status: DC | PRN

## 2014-03-19 MED ORDER — NEOSTIGMINE METHYLSULFATE 10 MG/10ML IV SOLN
INTRAVENOUS | Status: DC | PRN
  Administered 2014-03-19: 4 mg via INTRAVENOUS

## 2014-03-19 MED ORDER — ACETAMINOPHEN 325 MG PO TABS: 650.0000 mg | ORAL_TABLET | ORAL | Status: DC | PRN

## 2014-03-19 MED ORDER — BUPIVACAINE-EPINEPHRINE 0.25% -1:200000 IJ SOLN
INTRAMUSCULAR | Status: AC
  Filled 2014-03-19: qty 1

## 2014-03-19 MED ORDER — ONDANSETRON HCL 4 MG/2ML IJ SOLN
INTRAMUSCULAR | Status: DC | PRN
  Administered 2014-03-19: 4 mg via INTRAVENOUS

## 2014-03-19 MED ORDER — LACTATED RINGERS IV SOLN
INTRAVENOUS | Status: DC | PRN
  Administered 2014-03-19: 08:00:00

## 2014-03-19 MED ORDER — FENTANYL CITRATE 0.05 MG/ML IJ SOLN
INTRAMUSCULAR | Status: DC | PRN
Start: 2014-03-19 — End: 2014-03-19
  Administered 2014-03-19: 100 ug via INTRAVENOUS
  Administered 2014-03-19 (×3): 50 ug via INTRAVENOUS

## 2014-03-19 MED ORDER — FENOFIBRATE 160 MG PO TABS
160.0000 mg | ORAL_TABLET | Freq: Every day | ORAL | Status: DC
  Administered 2014-03-20: 160 mg via ORAL
  Filled 2014-03-19 (×2): qty 1

## 2014-03-19 MED ORDER — MORPHINE SULFATE 2 MG/ML IJ SOLN: 2.0000 mg | INTRAMUSCULAR | Status: DC | PRN

## 2014-03-19 MED ORDER — BUPIVACAINE-EPINEPHRINE 0.25% -1:200000 IJ SOLN
INTRAMUSCULAR | Status: DC | PRN
  Administered 2014-03-19: 30 mL

## 2014-03-19 MED ORDER — SODIUM CHLORIDE 0.9 % IV BOLUS (SEPSIS)
1000.0000 mL | Freq: Once | INTRAVENOUS | Status: AC
  Administered 2014-03-19: 1000 mL via INTRAVENOUS

## 2014-03-19 MED ORDER — ONDANSETRON HCL 4 MG/2ML IJ SOLN
INTRAMUSCULAR | Status: AC
  Filled 2014-03-19: qty 2

## 2014-03-19 MED ORDER — HYDROMORPHONE HCL PF 2 MG/ML IJ SOLN
INTRAMUSCULAR | Status: AC
  Filled 2014-03-19: qty 1

## 2014-03-19 MED ORDER — HYDROMORPHONE HCL PF 1 MG/ML IJ SOLN
INTRAMUSCULAR | Status: DC | PRN
  Administered 2014-03-19: 0.5 mg via INTRAVENOUS

## 2014-03-19 MED ORDER — LACTATED RINGERS IV SOLN: INTRAVENOUS | Status: DC

## 2014-03-19 MED ORDER — METOPROLOL SUCCINATE ER 50 MG PO TB24
50.0000 mg | ORAL_TABLET | Freq: Every day | ORAL | Status: DC
  Administered 2014-03-20: 50 mg via ORAL
  Filled 2014-03-19 (×2): qty 1

## 2014-03-19 MED ORDER — HYDROCODONE-ACETAMINOPHEN 5-325 MG PO TABS: 1.0000 | ORAL_TABLET | Freq: Four times a day (QID) | ORAL | Status: DC | PRN

## 2014-03-19 MED ORDER — OMEGA-3-ACID ETHYL ESTERS 1 G PO CAPS
1.0000 g | ORAL_CAPSULE | Freq: Every day | ORAL | Status: DC
  Administered 2014-03-20: 1 g via ORAL
  Filled 2014-03-19 (×2): qty 1

## 2014-03-19 MED ORDER — HYDROCODONE-ACETAMINOPHEN 5-325 MG PO TABS: 1.0000 | ORAL_TABLET | ORAL | Status: DC | PRN

## 2014-03-19 MED ORDER — PROMETHAZINE HCL 25 MG/ML IJ SOLN: 6.2500 mg | INTRAMUSCULAR | Status: DC | PRN

## 2014-03-19 MED ORDER — PANTOPRAZOLE SODIUM 40 MG PO TBEC
80.0000 mg | DELAYED_RELEASE_TABLET | Freq: Every day | ORAL | Status: DC
  Administered 2014-03-20: 80 mg via ORAL
  Filled 2014-03-19: qty 2

## 2014-03-19 MED ORDER — SIMVASTATIN 40 MG PO TABS
40.0000 mg | ORAL_TABLET | Freq: Every morning | ORAL | Status: DC
  Administered 2014-03-20: 40 mg via ORAL
  Filled 2014-03-19: qty 1

## 2014-03-19 MED ORDER — VALSARTAN-HYDROCHLOROTHIAZIDE 160-12.5 MG PO TABS: 1.0000 | ORAL_TABLET | Freq: Every morning | ORAL | Status: DC

## 2014-03-19 MED ORDER — PROPOFOL 10 MG/ML IV BOLUS
INTRAVENOUS | Status: DC | PRN
  Administered 2014-03-19: 200 mg via INTRAVENOUS

## 2014-03-19 MED ORDER — SODIUM CHLORIDE 0.9 % IR SOLN
Status: DC | PRN
  Administered 2014-03-19: 300 mL via INTRAVESICAL

## 2014-03-19 MED ORDER — IRBESARTAN 150 MG PO TABS
150.0000 mg | ORAL_TABLET | Freq: Every day | ORAL | Status: DC
  Administered 2014-03-20: 150 mg via ORAL
  Filled 2014-03-19 (×2): qty 1

## 2014-03-19 SURGICAL SUPPLY — 42 items
ADH SKN CLS APL DERMABOND .7 (GAUZE/BANDAGES/DRESSINGS) ×2
CABLE HIGH FREQUENCY MONO STRZ (ELECTRODE) ×3 IMPLANT
CANISTER SUCTION 2500CC (MISCELLANEOUS) ×3 IMPLANT
CATH FOLEY 2WAY SLVR 18FR 30CC (CATHETERS) ×3 IMPLANT
CATH ROBINSON RED A/P 16FR (CATHETERS) ×3 IMPLANT
CATH ROBINSON RED A/P 8FR (CATHETERS) ×3 IMPLANT
CATH TIEMANN FOLEY 18FR 5CC (CATHETERS) ×3 IMPLANT
CHLORAPREP W/TINT 26ML (MISCELLANEOUS) ×3 IMPLANT
CLIP LIGATING HEM O LOK PURPLE (MISCELLANEOUS) ×6 IMPLANT
CLOTH BEACON ORANGE TIMEOUT ST (SAFETY) ×3 IMPLANT
COVER SURGICAL LIGHT HANDLE (MISCELLANEOUS) ×3 IMPLANT
COVER TIP SHEARS 8 DVNC (MISCELLANEOUS) ×2 IMPLANT
COVER TIP SHEARS 8MM DA VINCI (MISCELLANEOUS) ×1
CUTTER ECHEON FLEX ENDO 45 340 (ENDOMECHANICALS) ×3 IMPLANT
DECANTER SPIKE VIAL GLASS SM (MISCELLANEOUS) ×3 IMPLANT
DERMABOND ADVANCED (GAUZE/BANDAGES/DRESSINGS) ×1
DERMABOND ADVANCED .7 DNX12 (GAUZE/BANDAGES/DRESSINGS) IMPLANT
DRAPE SURG IRRIG POUCH 19X23 (DRAPES) ×3 IMPLANT
DRSG TEGADERM 4X4.75 (GAUZE/BANDAGES/DRESSINGS) ×3 IMPLANT
DRSG TEGADERM 6X8 (GAUZE/BANDAGES/DRESSINGS) ×6 IMPLANT
ELECT REM PT RETURN 9FT ADLT (ELECTROSURGICAL) ×3
ELECTRODE REM PT RTRN 9FT ADLT (ELECTROSURGICAL) ×2 IMPLANT
GLOVE BIO SURGEON STRL SZ 6.5 (GLOVE) ×3 IMPLANT
GLOVE BIOGEL M STRL SZ7.5 (GLOVE) ×6 IMPLANT
GOWN STRL REUS W/TWL LRG LVL3 (GOWN DISPOSABLE) ×9 IMPLANT
HOLDER FOLEY CATH W/STRAP (MISCELLANEOUS) ×3 IMPLANT
IV LACTATED RINGERS 1000ML (IV SOLUTION) ×2 IMPLANT
KIT ACCESSORY DA VINCI DISP (KITS) ×1
KIT ACCESSORY DVNC DISP (KITS) ×2 IMPLANT
NDL SAFETY ECLIPSE 18X1.5 (NEEDLE) ×2 IMPLANT
NEEDLE HYPO 18GX1.5 SHARP (NEEDLE)
PACK ROBOT UROLOGY CUSTOM (CUSTOM PROCEDURE TRAY) ×3 IMPLANT
RELOAD GREEN ECHELON 45 (STAPLE) ×3 IMPLANT
SET TUBE IRRIG SUCTION NO TIP (IRRIGATION / IRRIGATOR) ×3 IMPLANT
SOLUTION ELECTROLUBE (MISCELLANEOUS) ×3 IMPLANT
SUT ETHILON 3 0 PS 1 (SUTURE) ×3 IMPLANT
SUT MNCRL AB 4-0 PS2 18 (SUTURE) ×6 IMPLANT
SUT VICRYL 0 UR6 27IN ABS (SUTURE) ×6 IMPLANT
SYRINGE 1CC 27X.5 TB SAFETYGLD (MISCELLANEOUS) ×3 IMPLANT
TOWEL OR 17X26 10 PK STRL BLUE (TOWEL DISPOSABLE) ×3 IMPLANT
TOWEL OR NON WOVEN STRL DISP B (DISPOSABLE) ×3 IMPLANT
WATER STERILE IRR 1500ML POUR (IV SOLUTION) ×6 IMPLANT

## 2014-03-19 NOTE — Progress Notes (Signed)
Patient ID: [name], male   DOB: 02/16/1951, 63 y.o.   MRN: [id number] Post-op note  Subjective: The patient is doing well.  No complaints.  Objective: Vital signs in last 24 hours: Temp:  [97.6 F (36.4 C)-98.2 F (36.8 C)] 98.2 F (36.8 C) (08/13 1440) Pulse Rate:  [71-87] 85 (08/13 1440) Resp:  [10-18] 14 (08/13 1440) BP: (101-158)/(53-88) 112/72 mmHg (08/13 1440) SpO2:  [98 %-100 %] 100 % (08/13 1440) FiO2 (%):  [2 %] 2 % (08/13 1315) Weight:  [103.42 kg (228 lb)] 103.42 kg (228 lb) (08/13 1315)  Intake/Output from previous day:   Intake/Output this shift: Total I/O In: 2460 [P.O.:60; I.V.:1400; IV Piggyback:1000] Out: 370 [Urine:200; Drains:70; Blood:100]  Physical Exam:  General: Alert and oriented. Abdomen: Soft, Nondistended. Incisions: Clean and dry. Urine: amber  Lab Results:  Recent Labs  03/19/14 1203  HGB 13.5  HCT 39.7    Assessment/Plan: POD#0   1) Continue to monitor  2) DVT prophy, clears, IS, amb, pain control   LOS: 0 days   Marcie Bal 03/19/2014, 3:02 PM

## 2014-03-19 NOTE — Discharge Instructions (Signed)

## 2014-03-19 NOTE — Anesthesia Preprocedure Evaluation (Addendum)
Anesthesia Evaluation  Patient identified by MRN, date of birth, ID band Patient awake    Reviewed: Allergy & Precautions, H&P , NPO status , Patient's Chart, lab work & pertinent test results  Airway Mallampati: II TM Distance: >3 FB Neck ROM: Full    Dental no notable dental hx.    Pulmonary neg pulmonary ROS,  breath sounds clear to auscultation  Pulmonary exam normal       Cardiovascular hypertension, Pt. on medications + dysrhythmias Supra Ventricular Tachycardia Rhythm:Regular Rate:Normal     Neuro/Psych negative neurological ROS  negative psych ROS   GI/Hepatic Neg liver ROS, hiatal hernia, GERD-  Medicated and Controlled,  Endo/Other  negative endocrine ROS  Renal/GU negative Renal ROS  negative genitourinary   Musculoskeletal negative musculoskeletal ROS (+)   Abdominal   Peds negative pediatric ROS (+)  Hematology negative hematology ROS (+)   Anesthesia Other Findings   Reproductive/Obstetrics negative OB ROS                          Anesthesia Physical Anesthesia Plan  ASA: II  Anesthesia Plan: General   Post-op Pain Management:    Induction: Intravenous  Airway Management Planned: Oral ETT  Additional Equipment:   Intra-op Plan:   Post-operative Plan: Extubation in OR  Informed Consent: I have reviewed the patients History and Physical, chart, labs and discussed the procedure including the risks, benefits and alternatives for the proposed anesthesia with the patient or authorized representative who has indicated his/her understanding and acceptance.   Dental advisory given  Plan Discussed with: CRNA  Anesthesia Plan Comments:         Anesthesia Quick Evaluation

## 2014-03-19 NOTE — Transfer of Care (Signed)
Immediate Anesthesia Transfer of Care Note  Patient: [name]  Procedure(s) Performed: Procedure(s) (LRB): ROBOTIC ASSISTED LAPAROSCOPIC RADICAL PROSTATECTOMY LEVEL 3 (N/A) LYMPHADENECTOMY (Bilateral)  Patient Location: PACU  Anesthesia Type: General  Level of Consciousness: sedated, patient cooperative and responds to stimulation  Airway & Oxygen Therapy: Patient Spontanous Breathing and Patient connected to face mask oxgen  Post-op Assessment: Report given to PACU RN and Post -op Vital signs reviewed and stable  Post vital signs: Reviewed and stable  Complications: No apparent anesthesia complications

## 2014-03-19 NOTE — Anesthesia Postprocedure Evaluation (Signed)
  Anesthesia Post-op Note  Patient: [name]  Procedure(s) Performed: Procedure(s) (LRB): ROBOTIC ASSISTED LAPAROSCOPIC RADICAL PROSTATECTOMY LEVEL 3 (N/A) LYMPHADENECTOMY (Bilateral)  Patient Location: PACU  Anesthesia Type: General  Level of Consciousness: awake and alert   Airway and Oxygen Therapy: Patient Spontanous Breathing  Post-op Pain: mild  Post-op Assessment: Post-op Vital signs reviewed, Patient's Cardiovascular Status Stable, Respiratory Function Stable, Patent Airway and No signs of Nausea or vomiting  Last Vitals:  Filed Vitals:   03/19/14 1315  BP: 126/69  Pulse: 87  Temp: 36.8 C  Resp:     Post-op Vital Signs: stable   Complications: No apparent anesthesia complications

## 2014-03-19 NOTE — Progress Notes (Signed)
Hgb. -13.5- Hct. 39.7- results noted.

## 2014-03-19 NOTE — Progress Notes (Signed)
Hgb. And Hct. Drawn by lab. 

## 2014-03-19 NOTE — Op Note (Signed)
Preoperative diagnosis: Clinically localized adenocarcinoma of the prostate (clinical stage T1c Nx Mx)  Postoperative diagnosis: Clinically localized adenocarcinoma of the prostate (clinical stage T1c Nx Mx)  Procedure:  1. Robotic assisted laparoscopic radical prostatectomy (right nerve sparing) 2. Bilateral robotic assisted laparoscopic pelvic lymphadenectomy  Surgeon: Pryor Curia. M.D.  Assistant(s): Leta Baptist, PA-C  Resident assistant: Dr. Park Liter  Anesthesia: General  Complications: None  EBL: 100 mL  IVF:  1000 mL crystalloid  Specimens: 1. Prostate and seminal vesicles 2. Right pelvic lymph nodes 3. Left pelvic lymph nodes  Disposition of specimens: Pathology  Drains: 1. 20 Fr coude catheter 2. # 19 Blake pelvic drain  Indication: [name] is a 63 y.o. patient with clinically localized prostate cancer.  After a thorough review of the management options for treatment of prostate cancer, he elected to proceed with surgical therapy and the above procedure(s).  We have discussed the potential benefits and risks of the procedure, side effects of the proposed treatment, the likelihood of the patient achieving the goals of the procedure, and any potential problems that might occur during the procedure or recuperation. Informed consent has been obtained.  Description of procedure:  The patient was taken to the operating room and a general anesthetic was administered. He was given preoperative antibiotics, placed in the dorsal lithotomy position, and prepped and draped in the usual sterile fashion. Next a preoperative timeout was performed. A urethral catheter was placed into the bladder and a site was selected near the umbilicus for placement of the camera port. This was placed using a standard open Hassan technique which allowed entry into the peritoneal cavity under direct vision and without difficulty. A 12 mm port was placed and a pneumoperitoneum  established. The camera was then used to inspect the abdomen and there was no evidence of any intra-abdominal injuries or other abnormalities. The remaining abdominal ports were then placed. 8 mm robotic ports were placed in the right lower quadrant, left lower quadrant, and far left lateral abdominal wall. A 5 mm port was placed in the right upper quadrant and a 12 mm port was placed in the right lateral abdominal wall for laparoscopic assistance. All ports were placed under direct vision without difficulty. The surgical cart was then docked.   Utilizing the cautery scissors, the bladder was reflected posteriorly allowing entry into the space of Retzius and identification of the endopelvic fascia and prostate. The periprostatic fat was then removed from the prostate allowing full exposure of the endopelvic fascia. The endopelvic fascia was then incised from the apex back to the base of the prostate bilaterally and the underlying levator muscle fibers were swept laterally off the prostate thereby isolating the dorsal venous complex. The dorsal vein was then stapled and divided with a 45 mm Flex Echelon stapler. Attention then turned to the bladder neck which was divided anteriorly thereby allowing entry into the bladder and exposure of the urethral catheter. The catheter balloon was deflated and the catheter was brought into the operative field and used to retract the prostate anteriorly. The posterior bladder neck was then examined and was divided allowing further dissection between the bladder and prostate posteriorly until the vasa deferentia and seminal vessels were identified. The vasa deferentia were isolated, divided, and lifted anteriorly. The seminal vesicles were dissected down to their tips with care to control the seminal vascular arterial blood supply. These structures were then lifted anteriorly and the space between Denonvillier's fascia and the anterior rectum was  developed with a combination of  sharp and blunt dissection. This isolated the vascular pedicles of the prostate.  The lateral prostatic fascia on the right side of the prostate was then sharply incised allowing release of the neurovascular bundle. The vascular pedicle of the prostate on the right side was then ligated with Weck clips between the prostate and neurovascular bundle and divided with sharp cold scissor dissection resulting in neurovascular bundle preservation. On the left side, a wide non nerve sparing dissection was performed with Weck clips used to ligate the vascular pedicle of the prostate. The neurovascular bundle on the right side was then separated off the apex of the prostate and urethra.  The urethra was then sharply transected allowing the prostate specimen to be disarticulated. The pelvis was copiously irrigated and hemostasis was ensured. There was no evidence for rectal injury.  Attention then turned to the right pelvic sidewall. The fibrofatty tissue between the external iliac vein, confluence of the iliac vessels, hypogastric artery, and Cooper's ligament was dissected free from the pelvic sidewall with care to preserve the obturator nerve. Weck clips were used for lymphostasis and hemostasis. An identical procedure was performed on the contralateral side and the lymphatic packets were removed for permanent pathologic analysis.  Attention then turned to the urethral anastomosis. A 2-0 Vicryl slip knot was placed between Denonvillier's fascia, the posterior bladder neck, and the posterior urethra to reapproximate these structures. A double-armed 3-0 Monocryl suture was then used to perform a 360 running tension-free anastomosis between the bladder neck and urethra. A new urethral catheter was then placed into the bladder and irrigated. There were no blood clots within the bladder and the anastomosis appeared to be watertight. A #19 Blake drain was then brought through the left lateral 8 mm port site and  positioned appropriately within the pelvis. It was secured to the skin with a nylon suture. The surgical cart was then undocked. The right lateral 12 mm port site was closed at the fascial level with a 0 Vicryl suture placed laparoscopically. All remaining ports were then removed under direct vision. The prostate specimen was removed intact within the Endopouch retrieval bag via the periumbilical camera port site. This fascial opening was closed with two running 0 Vicryl sutures. 0.25% Marcaine was then injected into all port sites and all incisions were reapproximated at the skin level with 4-0 Monocryl subcuticular sutures. Dermabond was applied. The patient appeared to tolerate the procedure well and without complications. The patient was able to be extubated and transferred to the recovery unit in satisfactory condition.   Pryor Curia MD

## 2014-03-20 ENCOUNTER — Encounter (HOSPITAL_COMMUNITY): Payer: Self-pay | Admitting: Urology

## 2014-03-20 LAB — BASIC METABOLIC PANEL
Anion gap: 13 (ref 5–15)
BUN: 15 mg/dL (ref 6–23)
CALCIUM: 8.2 mg/dL — AB (ref 8.4–10.5)
CO2: 24 mEq/L (ref 19–32)
Chloride: 99 mEq/L (ref 96–112)
Creatinine, Ser: 1.37 mg/dL — ABNORMAL HIGH (ref 0.50–1.35)
GFR calc Af Amer: 62 mL/min — ABNORMAL LOW (ref >90)
GFR, EST NON AFRICAN AMERICAN: 53 mL/min — AB (ref >90)
Glucose, Bld: 107 mg/dL — ABNORMAL HIGH (ref 70–99)
Potassium: 3.5 mEq/L — ABNORMAL LOW (ref 3.7–5.3)
SODIUM: 136 meq/L — AB (ref 137–147)

## 2014-03-20 LAB — HEMOGLOBIN AND HEMATOCRIT, BLOOD
HCT: 35.1 % — ABNORMAL LOW (ref 39.0–52.0)
HCT: 36.4 % — ABNORMAL LOW (ref 39.0–52.0)
Hemoglobin: 11.8 g/dL — ABNORMAL LOW (ref 13.0–17.0)
Hemoglobin: 12.3 g/dL — ABNORMAL LOW (ref 13.0–17.0)

## 2014-03-20 MED ORDER — BISACODYL 10 MG RE SUPP
10.0000 mg | Freq: Once | RECTAL | Status: AC
  Administered 2014-03-20: 10 mg via RECTAL
  Filled 2014-03-20: qty 1

## 2014-03-20 NOTE — Progress Notes (Signed)
03/20/14 1230  Reviewed discharge instructions with patient and his wife. Patient verbalized understanding. Copy of discharge instructions and prescription given to patient.  Patient foley bag was changed to a leg bag. Patient was able to sign in to mychart prior to leaving.

## 2014-03-20 NOTE — Discharge Summary (Signed)
Date of admission: 03/19/2014  Date of discharge: 03/20/2014  Admission diagnosis: Prostate Cancer  Discharge diagnosis: Prostate Cancer  History and Physical: For full details, please see admission history and physical. Briefly, [name] is a 63 y.o. gentleman with localized prostate cancer.  After discussing management/treatment options, he elected to proceed with surgical treatment.  Hospital Course: [name] was taken to the operating room on 03/19/2014 and underwent a robotic assisted laparoscopic radical prostatectomy. He tolerated this procedure well and without complications. Postoperatively, he was able to be transferred to a regular hospital room following recovery from anesthesia.  He was able to begin ambulating the night of surgery. He remained hemodynamically stable overnight.  He had excellent urine output with appropriately minimal output from his pelvic drain and his pelvic drain was removed on POD #1.  He was transitioned to oral pain medication, tolerated a clear liquid diet, and had met all discharge criteria and was able to be discharged home later on POD#1.  Laboratory values:  Recent Labs  03/19/14 1203 03/20/14 0505  HGB 13.5 11.8*  HCT 39.7 35.1*    Disposition: Home  Discharge instruction: He was instructed to be ambulatory but to refrain from heavy lifting, strenuous activity, or driving. He was instructed on urethral catheter care.  Discharge medications:     Medication List    STOP taking these medications       aspirin 325 MG tablet     ibuprofen 200 MG tablet  Commonly known as:  ADVIL,MOTRIN     multivitamin with minerals Tabs tablet      TAKE these medications       acetaminophen 325 MG tablet  Commonly known as:  TYLENOL  Take 650 mg by mouth every 6 (six) hours as needed for moderate pain.     ciprofloxacin 500 MG tablet  Commonly known as:  CIPRO  Take 1 tablet (500 mg total) by mouth 2 (two) times daily. Start day prior  to office visit for foley removal     esomeprazole 40 MG capsule  Commonly known as:  NEXIUM  Take 40 mg by mouth daily before breakfast.     fenofibrate 145 MG tablet  Commonly known as:  TRICOR  Take 145 mg by mouth every morning.     HYDROcodone-acetaminophen 5-325 MG per tablet  Commonly known as:  NORCO  Take 1-2 tablets by mouth every 6 (six) hours as needed.     metoprolol succinate 50 MG 24 hr tablet  Commonly known as:  TOPROL-XL  Take 50 mg by mouth every morning. Take with or immediately following a meal.     omega-3 acid ethyl esters 1 G capsule  Commonly known as:  LOVAZA  Take 1 g by mouth daily.     simvastatin 40 MG tablet  Commonly known as:  ZOCOR  Take 40 mg by mouth every morning.     valsartan-hydrochlorothiazide 160-12.5 MG per tablet  Commonly known as:  DIOVAN-HCT  Take 1 tablet by mouth every morning.        Followup: He will followup in 1 week for catheter removal and to discuss his surgical pathology results.

## 2014-05-19 ENCOUNTER — Encounter: Payer: Self-pay | Admitting: Radiation Oncology

## 2014-05-19 NOTE — Progress Notes (Signed)
GU Location of Tumor / Histology: prostate adenocarcinoma   If Prostate Cancer, Gleason Score is (4 + 4) and PSA is (0.01 on 05/07/14, post op)   [name] presented 3 months ago with signs/symptoms of: elevated PSA   Biopsies of prostate (if applicable) revealed:  0/25/42 Diagnosis 1. Prostate, radical resection - PROSTATIC ADENOCARCINOMA, GLEASON SCORE 4+5=9. - CARCINOMA INVOLVES BOTH SIDES. - RIGHT MID ANTERIOR MARGIN IS FOCALLY POSITIVE FOR CARCINOMA. - SEE ONCOLOGY TABLE. 2. Lymph nodes, regional resection, left pelvic - SIX OF SIX LYMPH NODES NEGATIVE FOR CARCINOMA (0/6). 3. Lymph nodes, regional resection, right pelvic - SIX OF SIX LYMPH NODES NEGATIVE FOR CARCINOMA (0/6).  01/09/14   Past/Anticipated interventions by urology, if any: biopsy , radical prostactomy on 03/19/14  Past/Anticipated interventions by medical oncology, if any: none   Weight changes, if any: no  Bowel/Bladder complaints, if any: IPSS 1 Nausea/Vomiting, if any: no  Pain issues, if any: no   SAFETY ISSUES:  Prior radiation? no  Pacemaker/ICD? no  Possible current pregnancy? na  Is the patient on methotrexate? No  Current Complaints / other details: Married, Chief Financial Officer, no children. No family history of prostate cancer. Wife is ICU RN, [employer]. Positive margin, for consideration of adjuvant radiation therapy.

## 2014-05-20 ENCOUNTER — Ambulatory Visit
Admission: RE | Admit: 2014-05-20 | Discharge: 2014-05-20 | Disposition: A | Discharge status: Home / Self Care | Payer: 59 | Source: Ambulatory Visit | Attending: Radiation Oncology | Admitting: Radiation Oncology

## 2014-05-20 ENCOUNTER — Encounter: Payer: Self-pay | Admitting: Radiation Oncology

## 2014-05-20 VITALS — BP 131/74 | HR 76 | Temp 98.4°F | Resp 20 | Wt 226.3 lb

## 2014-05-20 DIAGNOSIS — C61 Malignant neoplasm of prostate: Secondary | ICD-10-CM | POA: Insufficient documentation

## 2014-05-20 DIAGNOSIS — Z510 Encounter for antineoplastic radiation therapy: Secondary | ICD-10-CM | POA: Diagnosis present

## 2014-05-20 NOTE — Progress Notes (Signed)
Please see the Nurse Progress Note in the MD Initial Consult Encounter for this patient. 

## 2014-05-20 NOTE — Progress Notes (Signed)
CC: Dr. Dutch Gray, Dr. Prince Solian  Followup note:  Diagnosis: Pathologic stage pT2c N0 vs. pT3a N0 adenocarcinoma prostate   History: [name] is a most pleasant 63 year old male who is seen today through the courtesy of Dr. Alinda Money for discussion of possible adjuvant versus salvage radiation therapy in the management of his carcinoma the prostate. I first saw the patient in consultation on 02/11/2014. His baseline PSA was around 2.9. His PSA in September 2014 increased to 4.01 and then increased further to 6.7 in the spring of 2015. A repeat PSA on 10/10/2013 decreased to 5.92. He underwent ultrasound-guided biopsies with Dr. Alinda Money on 01/09/2014. His was found to have Gleason 8 (4+4) involving 10% of one core from the right apex. He had Gleason 7 (4+3) involving 60% of one core from the left lateral base and 60% of one core from the left base. He had Gleason 7 (3+4) involving 5% of one core from the left lateral mid gland and 20% of one core from left mid gland. Gleason 6 was seen to involve 5% of one core from the left lateral apex. His gland volume was 34.2 cc. He is doing well from a GU and GI standpoint. His I PSS score is 0. At that time he did report erectile dysfunction. His staging workup included CT scans and a bone scan which were without evidence for metastatic disease. On 03/19/2014 he underwent a robotic assisted laparoscopic radical prostatectomy (right nerve sparing) by Dr. Alinda Money. On review of his pathology he was felt to have Gleason 9 (4+5) involving 20% of the prostate tissue. 12 lymph nodes were free of metastatic disease. There was focal margin positivity along the right mid anterior aspect of the prostate. He is doing well postoperatively and has good recovery of his urinary continence although he does leak sometimes late in the day. He seen today for discussion of possible radiation therapy.  Physical examination: Alert and oriented.   Filed Vitals:   05/20/14 0735  BP:  131/74  Pulse: 76  Temp: 98.4 F (36.9 C)  Resp: 20   Rectal examination not performed today.  Impression: Pathologic stage T2c vs. T3a adenocarcinoma prostate. We had a lengthy discussion regarding adjuvant versus salvage radiation therapy. With focal margin positivity, he is certainly a candidate for observation and potential salvage radiation therapy rather than adjuvant radiation therapy. We discussed pathologic findings which would have [place] consider adjuvant radiation therapy. Based on the available literature, I would favor initiation of salvage radiation therapy before his PSA rises beyond 0.2,  if he develops a biochemical recurrence. We discussed the potential acute and late toxicities of radiation therapy. We also discussed the fact that once radiation therapy begins, he may not see further improvement in his urinary continence. At a minimum, we would wait 3 months before starting adjuvant radiation therapy. He was given literature for review. He is somewhat anxious, and he wants to be proactive. He would also like to begin his radiation therapy so that he finishes before the end of the year. Therefore, will have him return for simulation/treatment planning in early November. Consent is signed. I encouraged him to thoroughly think over his decision in the meantime after reading the literature that I gave him for review. I understand that he would have one more PSA prior to initiation of treatment in mid November.  45 minutes was spent face-to-face the patient, primarily counseling the patient and coordinating his care .  Plan: As above

## 2014-06-08 ENCOUNTER — Ambulatory Visit
Admission: RE | Admit: 2014-06-08 | Discharge: 2014-06-08 | Disposition: A | Discharge status: Home / Self Care | Payer: 59 | Source: Ambulatory Visit | Attending: Radiation Oncology | Admitting: Radiation Oncology

## 2014-06-08 DIAGNOSIS — Z510 Encounter for antineoplastic radiation therapy: Secondary | ICD-10-CM | POA: Diagnosis not present

## 2014-06-08 DIAGNOSIS — C61 Malignant neoplasm of prostate: Secondary | ICD-10-CM

## 2014-06-08 NOTE — Progress Notes (Signed)
Complex simulation/treatment planning note: The patient was taken to the CT simulator. A Vac lock immobilization device was constructed. A red rubber tube was placed within the rectal vault. He was in catheterized for a urethrogram. He was then scanned. I chose and isocenter along the prostate bed. The CT data set was sent to the  MIM planning system I contoured his high risk prostate bed (CTV 66) and expanded this by 0.5 cm to create PTV 66. This volume will receive 6600 cGy in 33 sessions. Also contoured his normal structures including his bladder and rectum. He is now ready for IMRT simulation/treatment planning.

## 2014-06-10 ENCOUNTER — Encounter: Payer: Self-pay | Admitting: Radiation Oncology

## 2014-06-10 DIAGNOSIS — Z510 Encounter for antineoplastic radiation therapy: Secondary | ICD-10-CM | POA: Diagnosis not present

## 2014-06-10 NOTE — Progress Notes (Signed)
IMRT simulation/treatment planning note: [name] completed his IMRT planning in the management of his recurrent carcinoma the prostate. IMRT was chosen to decrease the risk for both acute and late rectal and bladder toxicity compared to 3-D conformal or conventional radiation therapy. Dose volume histograms were obtained for the bladder, rectum, and femoral heads in addition to his target high risk prostate bed volume. We met our departmental guidelines. I'm prescribing 6600 cGy in 33 sessions utilizing 6 MV photons, dual ARC VMAT IMRT.

## 2014-06-11 DIAGNOSIS — Z510 Encounter for antineoplastic radiation therapy: Secondary | ICD-10-CM | POA: Diagnosis not present

## 2014-06-17 ENCOUNTER — Ambulatory Visit
Admission: RE | Admit: 2014-06-17 | Discharge: 2014-06-17 | Disposition: A | Discharge status: Home / Self Care | Payer: 59 | Source: Ambulatory Visit | Attending: Radiation Oncology | Admitting: Radiation Oncology

## 2014-06-17 DIAGNOSIS — Z510 Encounter for antineoplastic radiation therapy: Secondary | ICD-10-CM | POA: Diagnosis not present

## 2014-06-18 ENCOUNTER — Inpatient Hospital Stay
Admission: RE | Admit: 2014-06-18 | Discharge: 2014-06-18 | Disposition: A | Discharge status: Home / Self Care | Payer: Self-pay | Source: Ambulatory Visit | Attending: Radiation Oncology | Admitting: Radiation Oncology

## 2014-06-18 ENCOUNTER — Ambulatory Visit
Admission: RE | Admit: 2014-06-18 | Discharge: 2014-06-18 | Disposition: A | Discharge status: Home / Self Care | Payer: 59 | Source: Ambulatory Visit | Attending: Radiation Oncology | Admitting: Radiation Oncology

## 2014-06-18 DIAGNOSIS — Z510 Encounter for antineoplastic radiation therapy: Secondary | ICD-10-CM | POA: Diagnosis not present

## 2014-06-18 DIAGNOSIS — C61 Malignant neoplasm of prostate: Secondary | ICD-10-CM

## 2014-06-18 NOTE — Progress Notes (Signed)
Patient education completed with patient. Gave patient "Radiation and You" booklet with all pertinent information marked and discussed, re: rectal irritation/care, fatigue, urinary/bladder irritation/management, nutrition, pain. Pt verbalized understanding, no questions verbalized. Teach back method used.

## 2014-06-19 ENCOUNTER — Ambulatory Visit
Admission: RE | Admit: 2014-06-19 | Discharge: 2014-06-19 | Disposition: A | Discharge status: Home / Self Care | Payer: 59 | Source: Ambulatory Visit | Attending: Radiation Oncology | Admitting: Radiation Oncology

## 2014-06-19 DIAGNOSIS — Z510 Encounter for antineoplastic radiation therapy: Secondary | ICD-10-CM | POA: Diagnosis not present

## 2014-06-22 ENCOUNTER — Ambulatory Visit
Admission: RE | Admit: 2014-06-22 | Discharge: 2014-06-22 | Disposition: A | Discharge status: Home / Self Care | Payer: 59 | Source: Ambulatory Visit | Attending: Radiation Oncology | Admitting: Radiation Oncology

## 2014-06-22 ENCOUNTER — Encounter: Payer: Self-pay | Admitting: Radiation Oncology

## 2014-06-22 VITALS — BP 133/86 | HR 68 | Temp 98.4°F | Resp 20 | Wt 231.2 lb

## 2014-06-22 DIAGNOSIS — Z510 Encounter for antineoplastic radiation therapy: Secondary | ICD-10-CM | POA: Diagnosis not present

## 2014-06-22 DIAGNOSIS — C61 Malignant neoplasm of prostate: Secondary | ICD-10-CM

## 2014-06-22 NOTE — Progress Notes (Signed)
Weekly Management Note:  Site:prostate bed Current Dose:  800  cGy Projected Dose:  6600 cGy  Narrative: The patient is seen today for routine under treatment assessment. CBCT/MVCT images/port films were reviewed. The chart was reviewed.   Bladder filling is excellent. No new new GU or GI difficulty.  Physical Examination:  Filed Vitals:   06/22/14 1059  BP: 133/86  Pulse: 68  Temp: 98.4 F (36.9 C)  Resp: 20  .  Weight: 231 lb 3.2 oz (104.872 kg). No change.  Impression: Tolerating radiation therapy well.  Plan: Continue radiation therapy as planned.

## 2014-06-22 NOTE — Progress Notes (Signed)
Patient denies pain, fatigue, bladder/bowel issues, loss of appetite.

## 2014-06-23 ENCOUNTER — Ambulatory Visit
Admission: RE | Admit: 2014-06-23 | Discharge: 2014-06-23 | Disposition: A | Discharge status: Home / Self Care | Payer: 59 | Source: Ambulatory Visit | Attending: Radiation Oncology | Admitting: Radiation Oncology

## 2014-06-23 DIAGNOSIS — Z510 Encounter for antineoplastic radiation therapy: Secondary | ICD-10-CM | POA: Diagnosis not present

## 2014-06-24 ENCOUNTER — Ambulatory Visit
Admission: RE | Admit: 2014-06-24 | Discharge: 2014-06-24 | Disposition: A | Discharge status: Home / Self Care | Payer: 59 | Source: Ambulatory Visit | Attending: Radiation Oncology | Admitting: Radiation Oncology

## 2014-06-24 DIAGNOSIS — Z510 Encounter for antineoplastic radiation therapy: Secondary | ICD-10-CM | POA: Diagnosis not present

## 2014-06-25 ENCOUNTER — Ambulatory Visit
Admission: RE | Admit: 2014-06-25 | Discharge: 2014-06-25 | Disposition: A | Discharge status: Home / Self Care | Payer: 59 | Source: Ambulatory Visit | Attending: Radiation Oncology | Admitting: Radiation Oncology

## 2014-06-25 DIAGNOSIS — Z510 Encounter for antineoplastic radiation therapy: Secondary | ICD-10-CM | POA: Diagnosis not present

## 2014-06-26 ENCOUNTER — Ambulatory Visit
Admission: RE | Admit: 2014-06-26 | Discharge: 2014-06-26 | Disposition: A | Discharge status: Home / Self Care | Payer: 59 | Source: Ambulatory Visit | Attending: Radiation Oncology | Admitting: Radiation Oncology

## 2014-06-26 DIAGNOSIS — Z510 Encounter for antineoplastic radiation therapy: Secondary | ICD-10-CM | POA: Diagnosis not present

## 2014-06-28 ENCOUNTER — Ambulatory Visit
Admission: RE | Admit: 2014-06-28 | Discharge: 2014-06-28 | Disposition: A | Discharge status: Home / Self Care | Payer: 59 | Source: Ambulatory Visit | Attending: Radiation Oncology | Admitting: Radiation Oncology

## 2014-06-28 DIAGNOSIS — Z510 Encounter for antineoplastic radiation therapy: Secondary | ICD-10-CM | POA: Diagnosis not present

## 2014-06-29 ENCOUNTER — Encounter: Payer: Self-pay | Admitting: Radiation Oncology

## 2014-06-29 ENCOUNTER — Ambulatory Visit
Admission: RE | Admit: 2014-06-29 | Discharge: 2014-06-29 | Disposition: A | Discharge status: Home / Self Care | Payer: 59 | Source: Ambulatory Visit | Attending: Radiation Oncology | Admitting: Radiation Oncology

## 2014-06-29 VITALS — BP 134/76 | HR 84 | Temp 98.2°F | Resp 20 | Wt 231.3 lb

## 2014-06-29 DIAGNOSIS — C61 Malignant neoplasm of prostate: Secondary | ICD-10-CM

## 2014-06-29 DIAGNOSIS — Z510 Encounter for antineoplastic radiation therapy: Secondary | ICD-10-CM | POA: Diagnosis not present

## 2014-06-29 NOTE — Progress Notes (Signed)
Patient denies pain, fatigue, loss of appetite, bladder/bowel issues. He states he urinates every 2 hours during the day, nocturia x 0-1.

## 2014-06-29 NOTE — Progress Notes (Signed)
Weekly Management Note:  Site: Prostate bed Current Dose:  2000  cGy Projected Dose: 6600  cGy  Narrative: The patient is seen today for routine under treatment assessment. CBCT/MVCT images/port films were reviewed. The chart was reviewed.   Bladder filling today is less than ideal.  The patient did not feel that his bladder was completely full.  No significant GU or GI difficulty.  Physical Examination:  Filed Vitals:   06/29/14 0819  BP: 134/76  Pulse: 84  Temp: 98.2 F (36.8 C)  Resp: 20  .  Weight: 231 lb 4.8 oz (104.917 kg).  No change.  Impression: Tolerating radiation therapy well.  Plan: Continue radiation therapy as planned.

## 2014-06-30 ENCOUNTER — Ambulatory Visit
Admission: RE | Admit: 2014-06-30 | Discharge: 2014-06-30 | Disposition: A | Discharge status: Home / Self Care | Payer: 59 | Source: Ambulatory Visit | Attending: Radiation Oncology | Admitting: Radiation Oncology

## 2014-06-30 DIAGNOSIS — Z510 Encounter for antineoplastic radiation therapy: Secondary | ICD-10-CM | POA: Diagnosis not present

## 2014-07-01 ENCOUNTER — Ambulatory Visit
Admission: RE | Admit: 2014-07-01 | Discharge: 2014-07-01 | Disposition: A | Discharge status: Home / Self Care | Payer: 59 | Source: Ambulatory Visit | Attending: Radiation Oncology | Admitting: Radiation Oncology

## 2014-07-01 DIAGNOSIS — Z510 Encounter for antineoplastic radiation therapy: Secondary | ICD-10-CM | POA: Diagnosis not present

## 2014-07-03 ENCOUNTER — Ambulatory Visit: Payer: 59

## 2014-07-06 ENCOUNTER — Ambulatory Visit
Admission: RE | Admit: 2014-07-06 | Discharge: 2014-07-06 | Disposition: A | Discharge status: Home / Self Care | Payer: 59 | Source: Ambulatory Visit | Attending: Radiation Oncology | Admitting: Radiation Oncology

## 2014-07-06 ENCOUNTER — Encounter: Payer: Self-pay | Admitting: Radiation Oncology

## 2014-07-06 VITALS — BP 133/79 | HR 69 | Temp 98.8°F | Resp 20 | Wt 235.5 lb

## 2014-07-06 DIAGNOSIS — Z510 Encounter for antineoplastic radiation therapy: Secondary | ICD-10-CM | POA: Diagnosis not present

## 2014-07-06 DIAGNOSIS — C61 Malignant neoplasm of prostate: Secondary | ICD-10-CM

## 2014-07-06 NOTE — Progress Notes (Signed)
Weekly Management Note:  Site: Prostate bed  Current Dose:  2600  cGy Projected Dose: 6600  cGy  Narrative: The patient is seen today for routine under treatment assessment. CBCT/MVCT images/port films were reviewed. The chart was reviewed.   Bladder filling satisfactory.  No GU or GI difficulties.  Physical Examination:  Filed Vitals:   07/06/14 0818  BP: 133/79  Pulse: 69  Temp: 98.8 F (37.1 C)  Resp: 20  .  Weight: 235 lb 8 oz (106.822 kg).  No change.  Impression: Tolerating radiation therapy well.  Plan: Continue radiation therapy as planned.

## 2014-07-06 NOTE — Progress Notes (Signed)
Patient denies pain, fatigue, urinary/bowel issues, loss of appetite.  

## 2014-07-07 ENCOUNTER — Ambulatory Visit
Admission: RE | Admit: 2014-07-07 | Discharge: 2014-07-07 | Disposition: A | Discharge status: Home / Self Care | Payer: 59 | Source: Ambulatory Visit | Attending: Radiation Oncology | Admitting: Radiation Oncology

## 2014-07-07 DIAGNOSIS — Z510 Encounter for antineoplastic radiation therapy: Secondary | ICD-10-CM | POA: Diagnosis not present

## 2014-07-08 ENCOUNTER — Ambulatory Visit
Admission: RE | Admit: 2014-07-08 | Discharge: 2014-07-08 | Disposition: A | Discharge status: Home / Self Care | Payer: 59 | Source: Ambulatory Visit | Attending: Radiation Oncology | Admitting: Radiation Oncology

## 2014-07-08 DIAGNOSIS — Z510 Encounter for antineoplastic radiation therapy: Secondary | ICD-10-CM | POA: Diagnosis not present

## 2014-07-09 ENCOUNTER — Ambulatory Visit
Admission: RE | Admit: 2014-07-09 | Discharge: 2014-07-09 | Disposition: A | Discharge status: Home / Self Care | Payer: 59 | Source: Ambulatory Visit | Attending: Radiation Oncology | Admitting: Radiation Oncology

## 2014-07-09 DIAGNOSIS — Z510 Encounter for antineoplastic radiation therapy: Secondary | ICD-10-CM | POA: Diagnosis not present

## 2014-07-10 ENCOUNTER — Ambulatory Visit: Payer: 59

## 2014-07-13 ENCOUNTER — Ambulatory Visit
Admission: RE | Admit: 2014-07-13 | Discharge: 2014-07-13 | Disposition: A | Discharge status: Home / Self Care | Payer: 59 | Source: Ambulatory Visit | Attending: Radiation Oncology | Admitting: Radiation Oncology

## 2014-07-13 ENCOUNTER — Encounter: Payer: Self-pay | Admitting: Radiation Oncology

## 2014-07-13 VITALS — BP 124/73 | HR 70 | Temp 98.0°F | Resp 20 | Wt 233.7 lb

## 2014-07-13 DIAGNOSIS — C61 Malignant neoplasm of prostate: Secondary | ICD-10-CM

## 2014-07-13 DIAGNOSIS — Z510 Encounter for antineoplastic radiation therapy: Secondary | ICD-10-CM | POA: Diagnosis not present

## 2014-07-13 NOTE — Progress Notes (Signed)
Weekly Management Note:  Site: Prostate bed Current Dose:  3600  cGy Projected Dose: 6600  cGy  Narrative: The patient is seen today for routine under treatment assessment. CBCT/MVCT images/port films were reviewed. The chart was reviewed.   Bladder filling is satisfactory.  He does have slight increasing urinary frequency and urgency as expected.  Nocturia 2.  Physical Examination:  Filed Vitals:   07/13/14 0825  BP: 124/73  Pulse: 70  Temp: 98 F (36.7 C)  Resp: 20  .  Weight: 233 lb 11.2 oz (106.006 kg).  No change.  Impression: Tolerating radiation therapy well.  Plan: Continue radiation therapy as planned.

## 2014-07-13 NOTE — Progress Notes (Signed)
Patient denies pain, fatigue, bowel issues, loss of appetite. He reports slightly increased urinary frequency and urgency, nocturia x 2 last night. He denies other urinary issues.

## 2014-07-14 ENCOUNTER — Ambulatory Visit
Admission: RE | Admit: 2014-07-14 | Discharge: 2014-07-14 | Disposition: A | Discharge status: Home / Self Care | Payer: 59 | Source: Ambulatory Visit | Attending: Radiation Oncology | Admitting: Radiation Oncology

## 2014-07-14 DIAGNOSIS — Z510 Encounter for antineoplastic radiation therapy: Secondary | ICD-10-CM | POA: Diagnosis not present

## 2014-07-15 ENCOUNTER — Ambulatory Visit
Admission: RE | Admit: 2014-07-15 | Discharge: 2014-07-15 | Disposition: A | Discharge status: Home / Self Care | Payer: 59 | Source: Ambulatory Visit | Attending: Radiation Oncology | Admitting: Radiation Oncology

## 2014-07-15 DIAGNOSIS — Z510 Encounter for antineoplastic radiation therapy: Secondary | ICD-10-CM | POA: Diagnosis not present

## 2014-07-16 ENCOUNTER — Ambulatory Visit
Admission: RE | Admit: 2014-07-16 | Discharge: 2014-07-16 | Disposition: A | Discharge status: Home / Self Care | Payer: 59 | Source: Ambulatory Visit | Attending: Radiation Oncology | Admitting: Radiation Oncology

## 2014-07-16 DIAGNOSIS — Z510 Encounter for antineoplastic radiation therapy: Secondary | ICD-10-CM | POA: Diagnosis not present

## 2014-07-17 ENCOUNTER — Ambulatory Visit
Admission: RE | Admit: 2014-07-17 | Discharge: 2014-07-17 | Disposition: A | Discharge status: Home / Self Care | Payer: 59 | Source: Ambulatory Visit | Attending: Radiation Oncology | Admitting: Radiation Oncology

## 2014-07-17 DIAGNOSIS — Z510 Encounter for antineoplastic radiation therapy: Secondary | ICD-10-CM | POA: Diagnosis not present

## 2014-07-20 ENCOUNTER — Encounter: Payer: Self-pay | Admitting: Radiation Oncology

## 2014-07-20 ENCOUNTER — Ambulatory Visit
Admission: RE | Admit: 2014-07-20 | Discharge: 2014-07-20 | Disposition: A | Discharge status: Home / Self Care | Payer: 59 | Source: Ambulatory Visit | Attending: Radiation Oncology | Admitting: Radiation Oncology

## 2014-07-20 VITALS — BP 140/82 | HR 72 | Temp 98.0°F | Resp 20 | Wt 233.8 lb

## 2014-07-20 DIAGNOSIS — C61 Malignant neoplasm of prostate: Secondary | ICD-10-CM

## 2014-07-20 DIAGNOSIS — Z510 Encounter for antineoplastic radiation therapy: Secondary | ICD-10-CM | POA: Diagnosis not present

## 2014-07-20 NOTE — Progress Notes (Signed)
Patient denies pain, loss of appetite, fatigue. He states he is having two soft bms daily, continues to have some urinary frequency, urgency, nocturia x 2.

## 2014-07-20 NOTE — Progress Notes (Signed)
Weekly Management Note:  Site: Prostate bed Current Dose:  4600  cGy Projected Dose: 6600  cGy  Narrative: The patient is seen today for routine under treatment assessment. CBCT/MVCT images/port films were reviewed. The chart was reviewed.   Bladder filling satisfactory.  No new GU or GI difficulties.  He shows me that a PSA from last week is 0.03.  Physical Examination:  Filed Vitals:   07/20/14 0817  BP: 140/82  Pulse: 72  Temp: 98 F (36.7 C)  Resp: 20  .  Weight: 233 lb 12.8 oz (106.051 kg).  No change.  Impression: Tolerating radiation therapy well.  Plan: Continue radiation therapy as planned.  His next PSA scheduled for February.

## 2014-07-21 ENCOUNTER — Ambulatory Visit
Admission: RE | Admit: 2014-07-21 | Discharge: 2014-07-21 | Disposition: A | Discharge status: Home / Self Care | Payer: 59 | Source: Ambulatory Visit | Attending: Radiation Oncology | Admitting: Radiation Oncology

## 2014-07-21 DIAGNOSIS — Z510 Encounter for antineoplastic radiation therapy: Secondary | ICD-10-CM | POA: Diagnosis not present

## 2014-07-22 ENCOUNTER — Ambulatory Visit
Admission: RE | Admit: 2014-07-22 | Discharge: 2014-07-22 | Disposition: A | Discharge status: Home / Self Care | Payer: 59 | Source: Ambulatory Visit | Attending: Radiation Oncology | Admitting: Radiation Oncology

## 2014-07-22 DIAGNOSIS — Z510 Encounter for antineoplastic radiation therapy: Secondary | ICD-10-CM | POA: Diagnosis not present

## 2014-07-23 ENCOUNTER — Ambulatory Visit
Admission: RE | Admit: 2014-07-23 | Discharge: 2014-07-23 | Disposition: A | Discharge status: Home / Self Care | Payer: 59 | Source: Ambulatory Visit | Attending: Radiation Oncology | Admitting: Radiation Oncology

## 2014-07-23 DIAGNOSIS — Z510 Encounter for antineoplastic radiation therapy: Secondary | ICD-10-CM | POA: Diagnosis not present

## 2014-07-24 ENCOUNTER — Ambulatory Visit
Admission: RE | Admit: 2014-07-24 | Discharge: 2014-07-24 | Disposition: A | Discharge status: Home / Self Care | Payer: 59 | Source: Ambulatory Visit | Attending: Radiation Oncology | Admitting: Radiation Oncology

## 2014-07-24 DIAGNOSIS — Z510 Encounter for antineoplastic radiation therapy: Secondary | ICD-10-CM | POA: Diagnosis not present

## 2014-07-27 ENCOUNTER — Ambulatory Visit
Admission: RE | Admit: 2014-07-27 | Discharge: 2014-07-27 | Disposition: A | Discharge status: Home / Self Care | Payer: 59 | Source: Ambulatory Visit | Attending: Radiation Oncology | Admitting: Radiation Oncology

## 2014-07-27 ENCOUNTER — Encounter: Payer: Self-pay | Admitting: Radiation Oncology

## 2014-07-27 VITALS — BP 141/76 | HR 74 | Temp 98.5°F | Resp 20 | Wt 233.1 lb

## 2014-07-27 DIAGNOSIS — C61 Malignant neoplasm of prostate: Secondary | ICD-10-CM

## 2014-07-27 DIAGNOSIS — Z510 Encounter for antineoplastic radiation therapy: Secondary | ICD-10-CM | POA: Diagnosis not present

## 2014-07-27 NOTE — Progress Notes (Signed)
Patient reports 4 days of right shoulder pain, occasionally throbbing. He has taken Tylenol which "knocked the edge off". He denies hx of injury, surgery to right shoulder. Advised he try ice alternating with heat, taking Ibuprofen prn. Patient denies loss of appetite, has some fatigue, continues with urinary freq, urgency,  nocturia x 2. He will complete next Monday.

## 2014-07-27 NOTE — Progress Notes (Signed)
Weekly Management Note:  Site: Prostate bed Current Dose:  5600 cGy Projected Dose: 6600  cGy  Narrative: The patient is seen today for routine under treatment assessment. CBCT/MVCT images/port films were reviewed. The chart was reviewed.   Right shoulder sore, which worries patient. No other c/o.   Physical Examination:  Filed Vitals:   07/27/14 0819  BP: 141/76  Pulse: 74  Temp: 98.5 F (36.9 C)  Resp: 20  .  Weight: 233 lb 1.6 oz (105.733 kg).  . Well appearing  Impression: Tolerating radiation therapy well.  Plan: Continue radiation therapy as planned.   Reviewed bone scan w/ patient (June - arthritic changes in knees and right shoulder ) and PSA from Oct of 0.01.  Unlikely that shoulder pain is due to mets.  If it continues he will inform Dr Valere Dross.  Also discussed counseling for anxiety r/t diagnosis.  He defers this for now.  -----------------------------------  Eppie Gibson, MD

## 2014-07-28 ENCOUNTER — Ambulatory Visit
Admission: RE | Admit: 2014-07-28 | Discharge: 2014-07-28 | Disposition: A | Discharge status: Home / Self Care | Payer: 59 | Source: Ambulatory Visit | Attending: Radiation Oncology | Admitting: Radiation Oncology

## 2014-07-28 DIAGNOSIS — Z510 Encounter for antineoplastic radiation therapy: Secondary | ICD-10-CM | POA: Diagnosis not present

## 2014-07-29 ENCOUNTER — Ambulatory Visit
Admission: RE | Admit: 2014-07-29 | Discharge: 2014-07-29 | Disposition: A | Discharge status: Home / Self Care | Payer: 59 | Source: Ambulatory Visit | Attending: Radiation Oncology | Admitting: Radiation Oncology

## 2014-07-29 DIAGNOSIS — Z510 Encounter for antineoplastic radiation therapy: Secondary | ICD-10-CM | POA: Diagnosis not present

## 2014-07-30 ENCOUNTER — Ambulatory Visit
Admission: RE | Admit: 2014-07-30 | Discharge: 2014-07-30 | Disposition: A | Discharge status: Home / Self Care | Payer: 59 | Source: Ambulatory Visit | Attending: Radiation Oncology | Admitting: Radiation Oncology

## 2014-07-30 ENCOUNTER — Ambulatory Visit: Payer: 59

## 2014-07-30 DIAGNOSIS — Z510 Encounter for antineoplastic radiation therapy: Secondary | ICD-10-CM | POA: Diagnosis not present

## 2014-07-31 ENCOUNTER — Ambulatory Visit: Payer: 59

## 2014-08-03 ENCOUNTER — Ambulatory Visit
Admission: RE | Admit: 2014-08-03 | Discharge: 2014-08-03 | Disposition: A | Discharge status: Home / Self Care | Payer: 59 | Source: Ambulatory Visit | Attending: Radiation Oncology | Admitting: Radiation Oncology

## 2014-08-03 ENCOUNTER — Ambulatory Visit: Payer: 59

## 2014-08-03 ENCOUNTER — Encounter: Payer: Self-pay | Admitting: Radiation Oncology

## 2014-08-03 VITALS — BP 154/85 | HR 67 | Resp 16 | Wt 232.6 lb

## 2014-08-03 DIAGNOSIS — Z510 Encounter for antineoplastic radiation therapy: Secondary | ICD-10-CM | POA: Diagnosis not present

## 2014-08-03 DIAGNOSIS — C61 Malignant neoplasm of prostate: Secondary | ICD-10-CM

## 2014-08-03 NOTE — Progress Notes (Signed)
Weekly Management Note:  Site: Prostate bed Current Dose:  6400  cGy Projected Dose: 6600  cGy  Narrative: The patient is seen today for routine under treatment assessment. CBCT/MVCT images/port films were reviewed. The chart was reviewed.   Bladder filling is less than ideal today.  No new GU or GI difficulties.  Physical Examination:  Filed Vitals:   08/03/14 0821  BP: 154/85  Pulse: 67  Resp: 16  .  Weight: 232 lb 9.6 oz (105.507 kg).  No change.  Impression: Tolerating radiation therapy well.  He will finish his radiation therapy tomorrow.  Plan: Continue radiation therapy as planned.  One-month follow-up visit after completion of radiation therapy.

## 2014-08-03 NOTE — Progress Notes (Signed)
Blood pressure elevate. Weight stable. Reports right shoulder pain 2 on a scale of 0-10. Updated medication list with prednisone, muscle relaxant, and pain medication. Reports nocturia x4. Reports a twinge when he voids x 2 days. Reports occasional leakage when he bends over. Reports intermittent frequency and urgency. One month follow up appointment card given because patient finishes tomorrow. Patient schedule for blood work with Dr. Alinda Money on 09/16/2013 then, follow up the subsequent week. Reports mild fatigue.

## 2014-08-04 ENCOUNTER — Ambulatory Visit: Payer: 59

## 2014-08-04 ENCOUNTER — Ambulatory Visit
Admission: RE | Admit: 2014-08-04 | Discharge: 2014-08-04 | Disposition: A | Discharge status: Home / Self Care | Payer: 59 | Source: Ambulatory Visit | Attending: Radiation Oncology | Admitting: Radiation Oncology

## 2014-08-04 DIAGNOSIS — Z510 Encounter for antineoplastic radiation therapy: Secondary | ICD-10-CM | POA: Diagnosis not present

## 2014-08-05 ENCOUNTER — Encounter: Payer: Self-pay | Admitting: Radiation Oncology

## 2014-08-05 ENCOUNTER — Ambulatory Visit: Payer: 59

## 2014-08-05 NOTE — Progress Notes (Signed)
Chart Note: On 06-17-14, [name] began his IMRT in the management of his prostate cancer. He was treated with dual arc VMAT IMRT with 2 sets of dynamic MLCs corresponding to one set of IMRT treatment devices [phone]).

## 2014-08-05 NOTE — Progress Notes (Signed)
[facility] End of Treatment Note  Name:[name]  Date: 08/05/2014 SHF:[id number] DOB:April 27, 1951   Status:outpatient    CC: Tivis Ringer, MD  Dr. Dutch Gray  REFERRING PHYSICIAN: Dr. 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 Edgerly tolerated his treatment well with no significant GU or GI toxicity by completion of therapy.                          PLAN: Routine followup in one month. Patient instructed to call if questions or worsening complaints in interim.

## 2014-09-15 ENCOUNTER — Encounter: Payer: Self-pay | Admitting: Radiation Oncology

## 2014-09-16 ENCOUNTER — Ambulatory Visit
Admission: RE | Admit: 2014-09-16 | Discharge: 2014-09-16 | Disposition: A | Discharge status: Home / Self Care | Payer: 59 | Source: Ambulatory Visit | Attending: Radiation Oncology | Admitting: Radiation Oncology

## 2014-09-16 VITALS — BP 128/84 | HR 79 | Temp 98.3°F | Ht 74.0 in | Wt 229.0 lb

## 2014-09-16 DIAGNOSIS — C61 Malignant neoplasm of prostate: Secondary | ICD-10-CM

## 2014-09-16 HISTORY — DX: Personal history of irradiation: Z92.3

## 2014-09-16 NOTE — Progress Notes (Signed)
[name] here for reassessment s/p radiation to his pelvis for prostate cancer.  He reports urgency and cannot post urination,  Mild leakage upon bending, pushing and pulling, and nocturia x 2. Denies any burning nor discomfort upon urination.

## 2014-09-16 NOTE — Progress Notes (Signed)
CC: Dr. Dutch Gray  Follow-up note:  [name] returns today approximately 6 weeks following completion of adjuvant radiation therapy in the management of his pathologic stage pT2c vs. pT3a adenocarcinoma prostate.  He had Gleason 9 disease.  He still doing well from a GU and GI standpoint.  He still has some urinary urgency with nocturia 1-2 which is slowly improving.  Unfortunately, he required a cervical spine fusion for nerve impingement following his radiation therapy.  He had blood work done today for his PSA with an upcoming visit with Dr. Alinda Money.  His examination: Alert and oriented. Filed Vitals:   09/16/14 1141  BP: 128/84  Pulse: 79  Temp: 98.3 F (36.8 C)   Rectal examination not performed today.    Impression: Satisfactory progress.  Plan: Follow-up with Dr. Alinda Money in the near future.  I've not scheduled [name] for a formal follow-up visit and I ask that Dr. Alinda Money keep me posted on his progress.

## 2015-02-02 ENCOUNTER — Ambulatory Visit
Admission: RE | Admit: 2015-02-02 | Discharge: 2015-02-02 | Disposition: A | Discharge status: Home / Self Care | Payer: 59 | Source: Ambulatory Visit | Attending: Otolaryngology | Admitting: Otolaryngology

## 2015-02-02 ENCOUNTER — Other Ambulatory Visit: Payer: Self-pay | Admitting: Otolaryngology

## 2015-02-02 DIAGNOSIS — H9120 Sudden idiopathic hearing loss, unspecified ear: Secondary | ICD-10-CM

## 2015-02-02 MED ORDER — GADOBENATE DIMEGLUMINE 529 MG/ML IV SOLN
20.0000 mL | Freq: Once | INTRAVENOUS | Status: AC | PRN
  Administered 2015-02-02: 20 mL via INTRAVENOUS

## 2015-03-19 ENCOUNTER — Emergency Department (HOSPITAL_BASED_OUTPATIENT_CLINIC_OR_DEPARTMENT_OTHER)
Admission: EM | Admit: 2015-03-19 | Discharge: 2015-03-20 | Disposition: A | Discharge status: Home / Self Care | Payer: 59 | Attending: Emergency Medicine | Admitting: Emergency Medicine

## 2015-03-19 ENCOUNTER — Encounter (HOSPITAL_BASED_OUTPATIENT_CLINIC_OR_DEPARTMENT_OTHER): Payer: Self-pay | Admitting: *Deleted

## 2015-03-19 ENCOUNTER — Emergency Department (HOSPITAL_BASED_OUTPATIENT_CLINIC_OR_DEPARTMENT_OTHER): Payer: 59

## 2015-03-19 DIAGNOSIS — Y9389 Activity, other specified: Secondary | ICD-10-CM | POA: Diagnosis not present

## 2015-03-19 DIAGNOSIS — Y29XXXA Contact with blunt object, undetermined intent, initial encounter: Secondary | ICD-10-CM | POA: Diagnosis not present

## 2015-03-19 DIAGNOSIS — Z86718 Personal history of other venous thrombosis and embolism: Secondary | ICD-10-CM | POA: Insufficient documentation

## 2015-03-19 DIAGNOSIS — I10 Essential (primary) hypertension: Secondary | ICD-10-CM | POA: Diagnosis not present

## 2015-03-19 DIAGNOSIS — Y998 Other external cause status: Secondary | ICD-10-CM | POA: Insufficient documentation

## 2015-03-19 DIAGNOSIS — Z880 Allergy status to penicillin: Secondary | ICD-10-CM | POA: Diagnosis not present

## 2015-03-19 DIAGNOSIS — K219 Gastro-esophageal reflux disease without esophagitis: Secondary | ICD-10-CM | POA: Diagnosis not present

## 2015-03-19 DIAGNOSIS — Z79899 Other long term (current) drug therapy: Secondary | ICD-10-CM | POA: Insufficient documentation

## 2015-03-19 DIAGNOSIS — E785 Hyperlipidemia, unspecified: Secondary | ICD-10-CM | POA: Insufficient documentation

## 2015-03-19 DIAGNOSIS — Y9289 Other specified places as the place of occurrence of the external cause: Secondary | ICD-10-CM | POA: Diagnosis not present

## 2015-03-19 DIAGNOSIS — S61219A Laceration without foreign body of unspecified finger without damage to nail, initial encounter: Secondary | ICD-10-CM

## 2015-03-19 DIAGNOSIS — S61211A Laceration without foreign body of left index finger without damage to nail, initial encounter: Secondary | ICD-10-CM | POA: Insufficient documentation

## 2015-03-19 DIAGNOSIS — H9190 Unspecified hearing loss, unspecified ear: Secondary | ICD-10-CM | POA: Diagnosis not present

## 2015-03-19 DIAGNOSIS — Z8546 Personal history of malignant neoplasm of prostate: Secondary | ICD-10-CM | POA: Insufficient documentation

## 2015-03-19 DIAGNOSIS — Z7982 Long term (current) use of aspirin: Secondary | ICD-10-CM | POA: Insufficient documentation

## 2015-03-19 HISTORY — DX: Unspecified hearing loss, unspecified ear: H91.90

## 2015-03-19 NOTE — Discharge Instructions (Signed)

## 2015-03-19 NOTE — ED Notes (Signed)
Laceration to his left 2nd digit. He hit it with a mallet while installing a retaining wall. Bleeding controlled. He took a Tylenol before coming here.

## 2015-03-19 NOTE — ED Provider Notes (Signed)
CSN: [id number]     Arrival date & time 03/19/15  2142 History  This chart was scribed for Kameshia Madruga, MD by Rayna Sexton, ED scribe. This patient was seen in room MH02/MH02 and the patient's care was started at 11:30 PM.  Chief Complaint  Patient presents with  . Extremity Laceration   Patient is a 64 y.o. male presenting with skin laceration. The history is provided by the patient. No language interpreter was used.  Laceration Location:  Hand Hand laceration location:  L hand Bleeding: controlled   Time since incident:  4 hours Laceration mechanism:  Blunt object Foreign body present:  No foreign bodies Worsened by:  Nothing tried Ineffective treatments:  None tried   HPI Comments: [name] is a 64 y.o. male who presents to the Emergency Department complaining of a laceration with controlled bleeding to his left 2nd digit with onset at 7:30 pm. He notes that a sledgehammer hit the affected finger which resulted in the lacerations. He denies any other areas of trauma.   Past Medical History  Diagnosis Date  . Hypertension   . Hyperlipemia   . GERD (gastroesophageal reflux disease)   . Prostate cancer 01/13/14    Gleason 4+4=8  . Paroxysmal SVT (supraventricular tachycardia)   . Frequent PVCs   . Dysrhythmia     PVC'S  . DVT of lower extremity (deep venous thrombosis)     left leg after knee surgery; DAILY ASA NOW  . H/O hiatal hernia   . S/P radiation therapy 06/17/2014 through 08/04/2014                                                       Prostate bed 6600 cGy in 33 sessions                         . Hearing loss    Past Surgical History  Procedure Laterality Date  . Hernia repair  2006    rt ing h  . Back surgery  2004    lumb lam  . Colonoscopy    . Knee surgery Left     arthroscopy  . Robot assisted laparoscopic radical prostatectomy N/A 03/19/2014    Procedure: ROBOTIC ASSISTED LAPAROSCOPIC RADICAL PROSTATECTOMY LEVEL 3;  Surgeon: Raynelle Bring, MD;   Location: [facility];  Service: Urology;  Laterality: N/A;  . Lymphadenectomy Bilateral 03/19/2014    Procedure: LYMPHADENECTOMY;  Surgeon: Raynelle Bring, MD;  Location: [facility];  Service: Urology;  Laterality: Bilateral;  . Prostate biopsy  01/09/14    glaeson 4+4=8   Family History  Problem Relation Age of Onset  . Heart disease Father   . Heart attack Father   . Colon cancer Maternal Grandmother   . Dementia Mother     mild   Social History  Substance Use Topics  . Smoking status: Never Smoker   . Smokeless tobacco: Never Used  . Alcohol Use: Yes     Comment: RARELY    Review of Systems  Skin: Positive for color change and wound.  All other systems reviewed and are negative.   Allergies  Clindamycin/lincomycin; Other; Penicillins; and Polymyxin b  Home Medications   Prior to Admission medications   Medication Sig Start Date End Date Taking? Authorizing Provider  acetaminophen (TYLENOL) 325  MG tablet Take 650 mg by mouth every 6 (six) hours as needed for moderate pain.    Historical Provider, MD  aspirin 325 MG EC tablet Take 325 mg by mouth daily.    Historical Provider, MD  esomeprazole (NEXIUM) 40 MG capsule Take 40 mg by mouth daily before breakfast.      Historical Provider, MD  fenofibrate (TRICOR) 145 MG tablet Take 145 mg by mouth every morning.     Historical Provider, MD  methocarbamol (ROBAXIN) 500 MG tablet Take 500 mg by mouth 4 (four) times daily.    Historical Provider, MD  metoprolol succinate (TOPROL-XL) 50 MG 24 hr tablet Take 50 mg by mouth every morning. Take with or immediately following a meal.    Historical Provider, MD  omega-3 acid ethyl esters (LOVAZA) 1 G capsule Take 1 g by mouth daily.     Historical Provider, MD  simvastatin (ZOCOR) 40 MG tablet Take 40 mg by mouth every morning.     Historical Provider, MD  valsartan-hydrochlorothiazide (DIOVAN-HCT) 160-12.5 MG per tablet Take 1 tablet by mouth every morning.     Historical Provider, MD   BP 142/74  mmHg  Pulse 83  Temp(Src) 98.1 F (36.7 C) (Oral)  Resp 20  Ht 6\' 2"  (1.88 m)  Wt 230 lb (104.327 kg)  BMI 29.52 kg/m2  SpO2 97% Physical Exam  Constitutional: He is oriented to person, place, and time. He appears well-developed and well-nourished. He appears distressed.  HENT:  Head: Normocephalic and atraumatic.  Mouth/Throat: Oropharynx is clear and moist.  Eyes: Pupils are equal, round, and reactive to light.  Neck: Normal range of motion. Neck supple.  Cardiovascular: Normal rate, regular rhythm and normal heart sounds.  Exam reveals no gallop and no friction rub.   No murmur heard. Pulmonary/Chest: Effort normal and breath sounds normal. No respiratory distress. He has no wheezes. He has no rales. He exhibits no tenderness.  Abdominal: Soft. Bowel sounds are normal. There is tenderness.  Musculoskeletal: Normal range of motion. He exhibits no edema or tenderness.       Left hand: He exhibits laceration. He exhibits normal range of motion, no tenderness, no bony tenderness, normal two-point discrimination, normal capillary refill, no deformity and no swelling. Normal sensation noted. Normal strength noted. He exhibits no thumb/finger opposition.       Hands: Neurological: He is alert and oriented to person, place, and time.  Skin: Skin is warm and dry.  1 cm vertical laceration to left 2nd digit with 4 mm horizontal laceration next to it  Psychiatric: He has a normal mood and affect.  Nursing note and vitals reviewed.   ED Course  Procedures  DIAGNOSTIC STUDIES: Oxygen Saturation is 97% on RA, normal by my interpretation.    COORDINATION OF CARE: 11:32 PM Discussed treatment plan with pt at bedside and pt agreed to plan.  Labs Review Labs Reviewed - No data to display  Imaging Review Dg Hand Complete Left  03/19/2015   CLINICAL DATA:  Second digit laceration  EXAM: LEFT HAND - COMPLETE 3+ VIEW  COMPARISON:  None.  FINDINGS: Three views of left hand submitted. No acute  fracture or subluxation. No radiopaque foreign body. There are bandage artifact second finger.  IMPRESSION: Negative.   Electronically Signed   By: Lahoma Crocker M.D.   On: 03/19/2015 22:38   I, Rayna Sexton, personally reviewed and evaluated these images and lab results as part of my medical decision-making.   EKG Interpretation None  MDM   Final diagnoses:  None    LACERATION REPAIR Performed by: Carlisle Beers Authorized by: Carlisle Beers Consent: Verbal consent obtained. Risks and benefits: risks, benefits and alternatives were discussed Consent given by: patient Patient identity confirmed: provided demographic data Prepped and Draped in normal sterile fashion Wound explored  Laceration Location: palmar surface of left index finger   Laceration Length: 1 cm  No Foreign Bodies seen or palpated  Irrigation method: syringe Amount of cleaning: standard  Skin closure: dermabond  Patient tolerance: Patient tolerated the procedure well with no immediate complications.   Placed in a splint and dressing to keep clean and dry  I personally performed the services described in this documentation, which was scribed in my presence. The recorded information has been reviewed and is accurate.      Veatrice Kells, MD 03/20/15 [phone]

## 2015-03-20 ENCOUNTER — Encounter (HOSPITAL_BASED_OUTPATIENT_CLINIC_OR_DEPARTMENT_OTHER): Payer: Self-pay | Admitting: Emergency Medicine

## 2015-03-20 NOTE — ED Notes (Signed)
Pt. Verbalized understanding of d/c orders.

## 2015-04-17 DIAGNOSIS — Z91038 Other insect allergy status: Secondary | ICD-10-CM | POA: Insufficient documentation

## 2015-04-17 DIAGNOSIS — Z91030 Bee allergy status: Secondary | ICD-10-CM

## 2015-05-14 ENCOUNTER — Other Ambulatory Visit (HOSPITAL_COMMUNITY): Payer: Self-pay | Admitting: Otolaryngology

## 2015-05-14 DIAGNOSIS — H9192 Unspecified hearing loss, left ear: Secondary | ICD-10-CM

## 2015-05-24 ENCOUNTER — Other Ambulatory Visit (HOSPITAL_COMMUNITY): Payer: 59

## 2015-06-08 ENCOUNTER — Other Ambulatory Visit: Payer: Self-pay

## 2015-06-08 ENCOUNTER — Ambulatory Visit (HOSPITAL_COMMUNITY): Payer: 59 | Attending: Cardiovascular Disease

## 2015-06-08 DIAGNOSIS — I10 Essential (primary) hypertension: Secondary | ICD-10-CM | POA: Diagnosis not present

## 2015-06-08 DIAGNOSIS — E785 Hyperlipidemia, unspecified: Secondary | ICD-10-CM | POA: Insufficient documentation

## 2015-06-08 DIAGNOSIS — H9192 Unspecified hearing loss, left ear: Secondary | ICD-10-CM | POA: Diagnosis present

## 2015-06-08 DIAGNOSIS — I517 Cardiomegaly: Secondary | ICD-10-CM | POA: Insufficient documentation

## 2015-06-22 ENCOUNTER — Ambulatory Visit (INDEPENDENT_AMBULATORY_CARE_PROVIDER_SITE_OTHER): Payer: 59

## 2015-06-22 ENCOUNTER — Encounter: Payer: Self-pay | Admitting: Allergy and Immunology

## 2015-06-22 ENCOUNTER — Ambulatory Visit (INDEPENDENT_AMBULATORY_CARE_PROVIDER_SITE_OTHER): Payer: 59 | Admitting: Allergy and Immunology

## 2015-06-22 VITALS — BP 140/80 | HR 88 | Resp 12

## 2015-06-22 DIAGNOSIS — Z91038 Other insect allergy status: Secondary | ICD-10-CM

## 2015-06-22 DIAGNOSIS — Z91030 Bee allergy status: Secondary | ICD-10-CM

## 2015-06-22 DIAGNOSIS — T63441D Toxic effect of venom of bees, accidental (unintentional), subsequent encounter: Secondary | ICD-10-CM

## 2015-06-22 MED ORDER — EPINEPHRINE 0.3 MG/0.3ML IJ SOAJ: 0.3000 mg | Freq: Once | INTRAMUSCULAR | Status: DC

## 2015-06-22 NOTE — Patient Instructions (Signed)
  1. Continue hymenoptera immunotherapy every 8 weeks  2. Epi-Pen if needed.  3. Return in one year.

## 2015-06-22 NOTE — Progress Notes (Signed)
[facility]  Follow-up Note  Refering Provider: Prince Solian, MD Primary Provider: Tivis Ringer, MD  Subjective:   [name] is a 64 y.o. 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 He's been doing well while using immunotherapy now every 8 weeks directed against mixed vespid, wasp, and honeybee. He's had several field stings over the course the past year with no difficulty. He does continue to carry an EpiPen. He remains on Toprol for his systemic hypertension that requires 3 drugs to control.   Outpatient Encounter Prescriptions as of 06/22/2015  Medication Sig  . acetaminophen (TYLENOL) 325 MG tablet Take 650 mg by mouth every 6 (six) hours as needed for moderate pain.  Marland Kitchen aspirin 325 MG EC tablet Take 325 mg by mouth daily.  Marland Kitchen esomeprazole (NEXIUM) 40 MG capsule Take 40 mg by mouth daily before breakfast.    . fenofibrate (TRICOR) 145 MG tablet Take 145 mg by mouth every morning.   . metoprolol succinate (TOPROL-XL) 50 MG 24 hr tablet Take 50 mg by mouth every morning. Take with or immediately following a meal.  . omega-3 acid ethyl esters (LOVAZA) 1 G capsule Take 1 g by mouth daily.   . simvastatin (ZOCOR) 40 MG tablet Take 40 mg by mouth every morning.   . valsartan-hydrochlorothiazide (DIOVAN-HCT) 160-12.5 MG per tablet Take 1 tablet by mouth every morning.   . methocarbamol (ROBAXIN) 500 MG tablet Take 500 mg by mouth 4 (four) times daily.   No facility-administered encounter medications on file as of 06/22/2015.    No orders of the defined types were placed in this encounter.    Past Medical History  Diagnosis Date  . Hypertension   . Hyperlipemia   . GERD (gastroesophageal reflux disease)   . Prostate cancer 01/13/14    Gleason 4+4=8  . Paroxysmal SVT  (supraventricular tachycardia)   . Frequent PVCs   . Dysrhythmia     PVC'S  . DVT of lower extremity (deep venous thrombosis)     left leg after knee surgery; DAILY ASA NOW  . H/O hiatal hernia   . S/P radiation therapy 06/17/2014 through 08/04/2014                                                       Prostate bed 6600 cGy in 33 sessions                         . Hearing loss     Past Surgical History  Procedure Laterality Date  . Hernia repair  2006    rt ing h  . Back surgery  2004    lumb lam  . Colonoscopy    . Knee surgery Left     arthroscopy  . Robot assisted laparoscopic radical prostatectomy N/A 03/19/2014    Procedure: ROBOTIC ASSISTED LAPAROSCOPIC RADICAL PROSTATECTOMY LEVEL 3;  Surgeon: Raynelle Bring, MD;  Location: [facility];  Service: Urology;  Laterality: N/A;  . Lymphadenectomy Bilateral 03/19/2014    Procedure: LYMPHADENECTOMY;  Surgeon: Raynelle Bring, MD;  Location: [facility];  Service: Urology;  Laterality: Bilateral;  . Prostate biopsy  01/09/14    glaeson 4+4=8    Allergies  Allergen Reactions  . Clindamycin/Lincomycin Diarrhea  . Other     The gel used for ultrasound  Aqua sonic 100 - caused rash  The ekg electrodes cause skin irritation that last for weeks Allergic to bees and wasps  . Penicillins Hives  . Polymyxin B Hives  . Venomil Mixed Vespid [Mixed Vespid Venom]   . Venomil Wasp Venom [Wasp Venom]     Review of Systems  Constitutional: Negative.   HENT: Negative.   Eyes: Negative.   Respiratory: Negative.   Cardiovascular: Negative.   Gastrointestinal: Negative.   Endocrine: Negative.   Genitourinary:       Prostatectomy performed for prostate cancer with follow-up radiation therapy 2015  Musculoskeletal: Negative.   Hematological: Negative.      Objective:   Filed Vitals:   06/22/15 1620  BP: 140/80  Pulse: 88  Resp: 12          Physical Exam  Constitutional: He appears well-developed and well-nourished. No distress.  HENT:   Head: Normocephalic and atraumatic. Head is without right periorbital erythema and without left periorbital erythema.  Right Ear: Tympanic membrane, external ear and ear canal normal. No drainage or tenderness. No foreign bodies. Tympanic membrane is not injected, not scarred, not perforated, not erythematous, not retracted and not bulging. No middle ear effusion.  Left Ear: Tympanic membrane, external ear and ear canal normal. No drainage or tenderness. No foreign bodies. Tympanic membrane is not injected, not scarred, not perforated, not erythematous, not retracted and not bulging.  No middle ear effusion.  Nose: Nose normal. No mucosal edema, rhinorrhea, nose lacerations or sinus tenderness.  No foreign bodies.  Mouth/Throat: Oropharynx is clear and moist. No oropharyngeal exudate, posterior oropharyngeal edema, posterior oropharyngeal erythema or tonsillar abscesses.  Eyes: Lids are normal. Right eye exhibits no chemosis, no discharge and no exudate. No foreign body present in the right eye. Left eye exhibits no chemosis, no discharge and no exudate. No foreign body present in the left eye. Right conjunctiva is not injected. Left conjunctiva is not injected.  Neck: Neck supple. No tracheal tenderness present. No tracheal deviation and no edema present. No thyroid mass and no thyromegaly present.  Cardiovascular: Normal rate, regular rhythm, S1 normal and S2 normal.  Exam reveals no gallop.   No murmur heard. Pulmonary/Chest: No accessory muscle usage or stridor. No respiratory distress. He has no wheezes. He has no rhonchi. He has no rales.  Abdominal: Soft.  Lymphadenopathy:       Head (right side): No tonsillar adenopathy present.       Head (left side): No tonsillar adenopathy present.    He has no cervical adenopathy.  Neurological: He is alert.  Skin: No rash noted. He is not diaphoretic.  Psychiatric: He has a normal mood and affect. His behavior is normal.    Diagnostics:  None  Assessment and Plan:   1. Hymenoptera allergy      1. Continue hymenoptera immunotherapy every 8 weeks  2. Epi-Pen if needed.  3. Return in one year.  We will continue to have cord and use his immunotherapy every 8 weeks. At this point this will be indefinite therapy based upon the details of his initial reaction. We will see him back in this clinic in 1 year   Allena Katz, MD [place]

## 2015-08-12 ENCOUNTER — Ambulatory Visit (INDEPENDENT_AMBULATORY_CARE_PROVIDER_SITE_OTHER): Payer: 59

## 2015-08-12 DIAGNOSIS — T63441D Toxic effect of venom of bees, accidental (unintentional), subsequent encounter: Secondary | ICD-10-CM | POA: Diagnosis not present

## 2015-10-05 ENCOUNTER — Ambulatory Visit (INDEPENDENT_AMBULATORY_CARE_PROVIDER_SITE_OTHER): Payer: 59

## 2015-10-05 DIAGNOSIS — T63441D Toxic effect of venom of bees, accidental (unintentional), subsequent encounter: Secondary | ICD-10-CM | POA: Diagnosis not present

## 2015-12-02 ENCOUNTER — Ambulatory Visit (INDEPENDENT_AMBULATORY_CARE_PROVIDER_SITE_OTHER): Payer: 59

## 2015-12-02 DIAGNOSIS — T63441D Toxic effect of venom of bees, accidental (unintentional), subsequent encounter: Secondary | ICD-10-CM

## 2016-02-03 ENCOUNTER — Ambulatory Visit (INDEPENDENT_AMBULATORY_CARE_PROVIDER_SITE_OTHER): Payer: 59

## 2016-02-03 DIAGNOSIS — T63441D Toxic effect of venom of bees, accidental (unintentional), subsequent encounter: Secondary | ICD-10-CM

## 2016-03-30 ENCOUNTER — Ambulatory Visit (INDEPENDENT_AMBULATORY_CARE_PROVIDER_SITE_OTHER): Payer: 59

## 2016-03-30 DIAGNOSIS — T63441D Toxic effect of venom of bees, accidental (unintentional), subsequent encounter: Secondary | ICD-10-CM | POA: Diagnosis not present

## 2016-05-17 DIAGNOSIS — T63441D Toxic effect of venom of bees, accidental (unintentional), subsequent encounter: Secondary | ICD-10-CM | POA: Diagnosis not present

## 2016-05-18 ENCOUNTER — Ambulatory Visit (INDEPENDENT_AMBULATORY_CARE_PROVIDER_SITE_OTHER): Payer: 59 | Admitting: *Deleted

## 2016-05-18 DIAGNOSIS — T63441D Toxic effect of venom of bees, accidental (unintentional), subsequent encounter: Secondary | ICD-10-CM

## 2016-07-13 ENCOUNTER — Ambulatory Visit (INDEPENDENT_AMBULATORY_CARE_PROVIDER_SITE_OTHER): Payer: 59 | Admitting: *Deleted

## 2016-07-13 DIAGNOSIS — T63441D Toxic effect of venom of bees, accidental (unintentional), subsequent encounter: Secondary | ICD-10-CM

## 2016-09-07 ENCOUNTER — Ambulatory Visit (INDEPENDENT_AMBULATORY_CARE_PROVIDER_SITE_OTHER): Payer: 59 | Admitting: *Deleted

## 2016-09-07 DIAGNOSIS — T63441D Toxic effect of venom of bees, accidental (unintentional), subsequent encounter: Secondary | ICD-10-CM

## 2016-11-02 ENCOUNTER — Ambulatory Visit (INDEPENDENT_AMBULATORY_CARE_PROVIDER_SITE_OTHER): Payer: 59 | Admitting: *Deleted

## 2016-11-02 DIAGNOSIS — T63441D Toxic effect of venom of bees, accidental (unintentional), subsequent encounter: Secondary | ICD-10-CM

## 2016-12-28 ENCOUNTER — Ambulatory Visit (INDEPENDENT_AMBULATORY_CARE_PROVIDER_SITE_OTHER): Payer: 59 | Admitting: *Deleted

## 2016-12-28 DIAGNOSIS — T63441D Toxic effect of venom of bees, accidental (unintentional), subsequent encounter: Secondary | ICD-10-CM | POA: Diagnosis not present

## 2017-02-22 ENCOUNTER — Ambulatory Visit (INDEPENDENT_AMBULATORY_CARE_PROVIDER_SITE_OTHER): Payer: 59 | Admitting: *Deleted

## 2017-02-22 DIAGNOSIS — T63441D Toxic effect of venom of bees, accidental (unintentional), subsequent encounter: Secondary | ICD-10-CM

## 2017-04-18 ENCOUNTER — Ambulatory Visit (INDEPENDENT_AMBULATORY_CARE_PROVIDER_SITE_OTHER): Payer: 59

## 2017-04-18 DIAGNOSIS — T63441D Toxic effect of venom of bees, accidental (unintentional), subsequent encounter: Secondary | ICD-10-CM

## 2017-06-08 DIAGNOSIS — C61 Malignant neoplasm of prostate: Secondary | ICD-10-CM | POA: Diagnosis not present

## 2017-06-20 ENCOUNTER — Ambulatory Visit (INDEPENDENT_AMBULATORY_CARE_PROVIDER_SITE_OTHER): Payer: 59 | Admitting: *Deleted

## 2017-06-20 DIAGNOSIS — N5201 Erectile dysfunction due to arterial insufficiency: Secondary | ICD-10-CM | POA: Diagnosis not present

## 2017-06-20 DIAGNOSIS — T63441D Toxic effect of venom of bees, accidental (unintentional), subsequent encounter: Secondary | ICD-10-CM

## 2017-06-20 DIAGNOSIS — C61 Malignant neoplasm of prostate: Secondary | ICD-10-CM | POA: Diagnosis not present

## 2017-06-26 DIAGNOSIS — E7849 Other hyperlipidemia: Secondary | ICD-10-CM | POA: Diagnosis not present

## 2017-06-26 DIAGNOSIS — Z125 Encounter for screening for malignant neoplasm of prostate: Secondary | ICD-10-CM | POA: Diagnosis not present

## 2017-06-26 DIAGNOSIS — I10 Essential (primary) hypertension: Secondary | ICD-10-CM | POA: Diagnosis not present

## 2017-07-05 DIAGNOSIS — G4709 Other insomnia: Secondary | ICD-10-CM | POA: Diagnosis not present

## 2017-07-05 DIAGNOSIS — Z0000 Encounter for general adult medical examination without abnormal findings: Secondary | ICD-10-CM | POA: Diagnosis not present

## 2017-07-05 DIAGNOSIS — J302 Other seasonal allergic rhinitis: Secondary | ICD-10-CM | POA: Diagnosis not present

## 2017-07-05 DIAGNOSIS — E7849 Other hyperlipidemia: Secondary | ICD-10-CM | POA: Diagnosis not present

## 2017-07-05 DIAGNOSIS — Z23 Encounter for immunization: Secondary | ICD-10-CM | POA: Diagnosis not present

## 2017-07-05 DIAGNOSIS — K219 Gastro-esophageal reflux disease without esophagitis: Secondary | ICD-10-CM | POA: Diagnosis not present

## 2017-07-05 DIAGNOSIS — F438 Other reactions to severe stress: Secondary | ICD-10-CM | POA: Diagnosis not present

## 2017-07-05 DIAGNOSIS — C61 Malignant neoplasm of prostate: Secondary | ICD-10-CM | POA: Diagnosis not present

## 2017-07-05 DIAGNOSIS — Z6830 Body mass index (BMI) 30.0-30.9, adult: Secondary | ICD-10-CM | POA: Diagnosis not present

## 2017-07-05 DIAGNOSIS — Z1389 Encounter for screening for other disorder: Secondary | ICD-10-CM | POA: Diagnosis not present

## 2017-07-05 DIAGNOSIS — I10 Essential (primary) hypertension: Secondary | ICD-10-CM | POA: Diagnosis not present

## 2017-07-05 DIAGNOSIS — S6992XA Unspecified injury of left wrist, hand and finger(s), initial encounter: Secondary | ICD-10-CM | POA: Diagnosis not present

## 2017-07-20 DIAGNOSIS — Z1212 Encounter for screening for malignant neoplasm of rectum: Secondary | ICD-10-CM | POA: Diagnosis not present

## 2017-08-13 ENCOUNTER — Ambulatory Visit (INDEPENDENT_AMBULATORY_CARE_PROVIDER_SITE_OTHER): Payer: Medicare Other | Admitting: *Deleted

## 2017-08-13 DIAGNOSIS — T63441D Toxic effect of venom of bees, accidental (unintentional), subsequent encounter: Secondary | ICD-10-CM | POA: Diagnosis not present

## 2017-09-20 DIAGNOSIS — H9041 Sensorineural hearing loss, unilateral, right ear, with unrestricted hearing on the contralateral side: Secondary | ICD-10-CM | POA: Diagnosis not present

## 2017-09-20 DIAGNOSIS — H8102 Meniere's disease, left ear: Secondary | ICD-10-CM | POA: Diagnosis not present

## 2017-09-20 DIAGNOSIS — H903 Sensorineural hearing loss, bilateral: Secondary | ICD-10-CM | POA: Diagnosis not present

## 2017-09-20 DIAGNOSIS — H9312 Tinnitus, left ear: Secondary | ICD-10-CM | POA: Diagnosis not present

## 2017-10-09 ENCOUNTER — Ambulatory Visit (INDEPENDENT_AMBULATORY_CARE_PROVIDER_SITE_OTHER): Payer: Medicare Other | Admitting: *Deleted

## 2017-10-09 DIAGNOSIS — J302 Other seasonal allergic rhinitis: Secondary | ICD-10-CM | POA: Diagnosis not present

## 2017-10-09 DIAGNOSIS — M5030 Other cervical disc degeneration, unspecified cervical region: Secondary | ICD-10-CM | POA: Diagnosis not present

## 2017-10-09 DIAGNOSIS — M5412 Radiculopathy, cervical region: Secondary | ICD-10-CM | POA: Diagnosis not present

## 2017-10-09 DIAGNOSIS — I10 Essential (primary) hypertension: Secondary | ICD-10-CM | POA: Diagnosis not present

## 2017-10-09 DIAGNOSIS — Z6830 Body mass index (BMI) 30.0-30.9, adult: Secondary | ICD-10-CM | POA: Diagnosis not present

## 2017-10-09 DIAGNOSIS — T63441D Toxic effect of venom of bees, accidental (unintentional), subsequent encounter: Secondary | ICD-10-CM | POA: Diagnosis not present

## 2017-10-10 ENCOUNTER — Other Ambulatory Visit: Payer: Self-pay | Admitting: Family Medicine

## 2017-10-10 DIAGNOSIS — M5412 Radiculopathy, cervical region: Secondary | ICD-10-CM

## 2017-10-14 ENCOUNTER — Ambulatory Visit
Admission: RE | Admit: 2017-10-14 | Discharge: 2017-10-14 | Disposition: A | Discharge status: Home / Self Care | Payer: 59 | Source: Ambulatory Visit | Attending: Family Medicine | Admitting: Family Medicine

## 2017-10-14 DIAGNOSIS — M4802 Spinal stenosis, cervical region: Secondary | ICD-10-CM | POA: Diagnosis not present

## 2017-10-14 DIAGNOSIS — M5412 Radiculopathy, cervical region: Secondary | ICD-10-CM

## 2017-10-15 ENCOUNTER — Other Ambulatory Visit: Payer: Self-pay

## 2017-10-15 ENCOUNTER — Emergency Department (HOSPITAL_BASED_OUTPATIENT_CLINIC_OR_DEPARTMENT_OTHER)
Admission: EM | Admit: 2017-10-15 | Discharge: 2017-10-15 | Disposition: A | Discharge status: Home / Self Care | Payer: Medicare Other | Attending: Emergency Medicine | Admitting: Emergency Medicine

## 2017-10-15 ENCOUNTER — Encounter (HOSPITAL_BASED_OUTPATIENT_CLINIC_OR_DEPARTMENT_OTHER): Payer: Self-pay | Admitting: *Deleted

## 2017-10-15 ENCOUNTER — Emergency Department (HOSPITAL_BASED_OUTPATIENT_CLINIC_OR_DEPARTMENT_OTHER): Payer: Medicare Other

## 2017-10-15 DIAGNOSIS — Z7982 Long term (current) use of aspirin: Secondary | ICD-10-CM | POA: Diagnosis not present

## 2017-10-15 DIAGNOSIS — Y929 Unspecified place or not applicable: Secondary | ICD-10-CM | POA: Insufficient documentation

## 2017-10-15 DIAGNOSIS — M79671 Pain in right foot: Secondary | ICD-10-CM

## 2017-10-15 DIAGNOSIS — Z79899 Other long term (current) drug therapy: Secondary | ICD-10-CM | POA: Diagnosis not present

## 2017-10-15 DIAGNOSIS — W108XXA Fall (on) (from) other stairs and steps, initial encounter: Secondary | ICD-10-CM | POA: Diagnosis not present

## 2017-10-15 DIAGNOSIS — I10 Essential (primary) hypertension: Secondary | ICD-10-CM | POA: Insufficient documentation

## 2017-10-15 DIAGNOSIS — S92351A Displaced fracture of fifth metatarsal bone, right foot, initial encounter for closed fracture: Secondary | ICD-10-CM | POA: Diagnosis not present

## 2017-10-15 DIAGNOSIS — Y999 Unspecified external cause status: Secondary | ICD-10-CM | POA: Diagnosis not present

## 2017-10-15 DIAGNOSIS — Y9301 Activity, walking, marching and hiking: Secondary | ICD-10-CM | POA: Insufficient documentation

## 2017-10-15 DIAGNOSIS — S99921A Unspecified injury of right foot, initial encounter: Secondary | ICD-10-CM | POA: Diagnosis present

## 2017-10-15 NOTE — ED Notes (Signed)
Pt was triaged by this RN.

## 2017-10-15 NOTE — ED Triage Notes (Addendum)
Pt states he thinks he missed a step and turned his right foot. C/O pain to same. Swelling and discoloration noted. +dpp palp Moves toes. Feels touch. Cap refill < 3 sec Ambulatory with walker. Ice applied.

## 2017-10-15 NOTE — ED Notes (Signed)
ED Provider at bedside discussing test results and plan of care. 

## 2017-10-15 NOTE — ED Notes (Signed)
Patient transported to X-ray 

## 2017-10-15 NOTE — ED Notes (Signed)
ED Provider at bedside. 

## 2017-10-15 NOTE — Discharge Instructions (Signed)
You were seen today and found to have a new metatarsal fracture.  You will be placed in a Cam walker boot.  Weight-bear as tolerated.  Follow-up with orthopedics.

## 2017-10-15 NOTE — ED Provider Notes (Addendum)
[facility]ERGENCY DEPARTMENT Provider Note   CSN: [id number] Arrival date & time: 10/15/17  0636     History   Chief Complaint Chief Complaint  Patient presents with  . Foot Pain    HPI [name] is a 68 y.o. male.  HPI  This is a 67 year old male with a history of DVT, hypertension, hyperlipidemia who presents with right foot pain.  Patient reports that he misstepped walking into his kitchen down some steps.  He heard a pop.  He had immediate right lateral foot pain.  He actually presented last night but given the weight went home.  He has been ambulatory with the assistance of a walker.  He does not normally use a walker.  He states most of his pain occurs when he uses the ball of his foot.  He denies any numbness or tingling.  He took 2 Motrin at home with some improvement of symptoms.  Currently does not have pain when at rest.  Past Medical History:  Diagnosis Date  . 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 Dysrhythmia    PVC'S  . Frequent PVCs   . GERD (gastroesophageal reflux disease)   . H/O hiatal hernia   . Hearing loss   . Hyperlipemia   . Hypertension   . Paroxysmal SVT (supraventricular tachycardia) ([place])   . Prostate cancer ([place]) 01/13/14   Gleason 4+4=8  . S/P radiation therapy 06/17/2014 through 08/04/2014                                                      Prostate bed 6600 cGy in 33 sessions                           Patient Active Problem List   Diagnosis Date Noted  . 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 Malignant neoplasm of prostate ([place]) 02/11/2014  . Medial meniscus tear 07/03/2011  . HYPERLIPIDEMIA-MIXED 02/02/2009  . HYPERTENSION, UNSPECIFIED 02/02/2009  . CHEST PAIN UNSPECIFIED 02/02/2009  . PREMATURE VENTRICULAR CONTRACTIONS 12/28/2008  . PALPITATIONS 12/28/2008    Past Surgical History:  Procedure Laterality Date  . BACK SURGERY  2004   lumb lam  .  COLONOSCOPY    . HERNIA REPAIR  2006   rt ing h  . KNEE SURGERY Left    arthroscopy  . LYMPHADENECTOMY Bilateral 03/19/2014   Procedure: LYMPHADENECTOMY;  Surgeon: Raynelle Bring, MD;  Location: [facility];  Service: Urology;  Laterality: Bilateral;  . PROSTATE BIOPSY  01/09/14   glaeson 4+4=8  . ROBOT ASSISTED LAPAROSCOPIC RADICAL PROSTATECTOMY N/A 03/19/2014   Procedure: ROBOTIC ASSISTED LAPAROSCOPIC RADICAL PROSTATECTOMY LEVEL 3;  Surgeon: Raynelle Bring, MD;  Location: [facility];  Service: Urology;  Laterality: N/A;       Home Medications    Prior to Admission medications   Medication Sig Start Date End Date Taking? Authorizing Provider  acetaminophen (TYLENOL) 325 MG tablet Take 650 mg by mouth every 6 (six) hours as needed for moderate pain.    [provider]  aspirin 325 MG EC tablet Take 325 mg by mouth daily.    [provider]  EPINEPHrine (EPIPEN 2-PAK) 0.3 mg/0.3 mL IJ SOAJ injection Inject 0.3 mLs (0.3  mg total) into the muscle once. 06/22/15   Kozlow, Donnamarie Poag, MD  esomeprazole (NEXIUM) 40 MG capsule Take 40 mg by mouth daily before breakfast.      [provider]  fenofibrate (TRICOR) 145 MG tablet Take 145 mg by mouth every morning.     [provider]  methocarbamol (ROBAXIN) 500 MG tablet Take 500 mg by mouth 4 (four) times daily.    [provider]  metoprolol succinate (TOPROL-XL) 50 MG 24 hr tablet Take 50 mg by mouth every morning. Take with or immediately following a meal.    [provider]  omega-3 acid ethyl esters (LOVAZA) 1 G capsule Take 1 g by mouth daily.     [provider]  simvastatin (ZOCOR) 40 MG tablet Take 40 mg by mouth every morning.     [provider]  valsartan-hydrochlorothiazide (DIOVAN-HCT) 160-12.5 MG per tablet Take 1 tablet by mouth every morning.     [provider]    Family History Family History  Problem Relation Age of Onset  . Heart disease Father   . Heart  attack Father   . Dementia Mother        mild  . Colon cancer Maternal Grandmother     Social History Social History   Tobacco Use  . Smoking status: Never Smoker  . Smokeless tobacco: Never Used  Substance Use Topics  . Alcohol use: Yes    Comment: RARELY  . Drug use: No     Allergies   Clindamycin/lincomycin; Other; Penicillins; Polymyxin b; Venomil mixed vespid [mixed vespid venom]; and Venomil wasp venom [wasp venom]   Review of Systems Review of Systems  Musculoskeletal:       Right foot pain  Skin: Positive for color change.  Neurological: Negative for weakness and numbness.  All other systems reviewed and are negative.    Physical Exam Updated Vital Signs BP 131/71 (BP Location: Right Arm)   Pulse 90   Temp 98.2 F (36.8 C) (Oral)   Resp 20   Ht 6' 2.5" (1.892 m)   Wt 104.3 kg (230 lb)   SpO2 100%   BMI 29.14 kg/m   Physical Exam  Constitutional: He is oriented to person, place, and time. He appears well-developed and well-nourished.  HENT:  Head: Normocephalic and atraumatic.  Cardiovascular: Normal rate and regular rhythm.  Pulmonary/Chest: Effort normal. No respiratory distress.  Musculoskeletal:  Focused examination of the right foot reveals mild swelling noted over the proximal lateral aspect of the foot with bruising noted, some tenderness to palpation, 2+ DP pulse, no obvious deformity, normal range of motion the ankle and toes, no midfoot tenderness, neurovascularly intact distally  Neurological: He is alert and oriented to person, place, and time.  Skin: Skin is warm and dry.  Psychiatric: He has a normal mood and affect.  Nursing note and vitals reviewed.    ED Treatments / Results  Labs (all labs ordered are listed, but only abnormal results are displayed) Labs Reviewed - No data to display  EKG  EKG Interpretation None       Radiology Mr Cervical Spine Wo Contrast  Result Date: 10/15/2017 CLINICAL DATA:  67 y/o M;  positional episodes of numbness on the left side of neck and upper shoulder as well as left index finger for 1 month. History of prostate cancer. EXAM: MRI CERVICAL SPINE WITHOUT CONTRAST TECHNIQUE: Multiplanar, multisequence MR imaging of the cervical spine was performed. No intravenous contrast was administered. COMPARISON:  11/09/2014 cervical radiographs. 08/14/2014 cervical MRI. FINDINGS: Alignment: Minimal grade 1 C7-T1 anterolisthesis. Mild reversal of cervical curvature at C3-4. Vertebrae: C4-C6 anterior cervical discectomy and fusion. Susceptibility artifact from fusion hardware partially obscures the vertebral bodies at those levels. Right C7-T1 trace facet effusion and edema, likely degenerative. No additional bone marrow edema. No abnormal disc signal. Cord: No abnormal cord signal. Posterior Fossa, vertebral arteries, paraspinal tissues: Negative. Disc levels: C2-3: Mild right-sided facet hypertrophy with mild right foraminal stenosis. No canal or left foraminal stenosis. C3-4: Disc osteophyte complex with progressed 5 mm central disc protrusion impinging anterior cord with mild canal stenosis. Bilateral uncovertebral and facet hypertrophy with moderate foraminal stenosis. C4-5: Discectomy and interbody fusion. Stable right greater than left uncovertebral and facet hypertrophy. Moderate to severe right and moderate left foraminal stenosis. No canal stenosis. C5-6: Discectomy and interbody fusion. Stable uncovertebral and facet hypertrophy with moderate to severe bilateral foraminal stenosis. No canal stenosis. C6-7: Left-sided uncovertebral and facet hypertrophy with mild left foraminal stenosis. No canal stenosis. C7-T1: No significant disc displacement, foraminal stenosis, or canal stenosis. IMPRESSION: 1. Minimal grade 1 C7-T1 anterolisthesis. 2. Mild degenerative edema of the right C7-T1 facet. 3. C4-6 anterior cervical discectomy and fusion with prominent C3-4 adjacent segment disease. 4. Mild  progression of C3-4 central disc protrusion with anterior cord impingement and mild canal stenosis. 5. Stable uncovertebral and facet hypertrophy with moderate to severe right C4-5 and and bilateral C5-6 foraminal stenosis. Multilevel mild and moderate foraminal stenosis. Electronically Signed   By: Kristine Garbe M.D.   On: 10/15/2017 04:13    Procedures Procedures (including critical care time)  Medications Ordered in ED Medications - No data to display   Initial Impression / Assessment and Plan / ED Course  I have reviewed the triage vital signs and the nursing notes.  Pertinent labs & imaging results that were available during my care of the patient were reviewed by me and considered in my medical decision making (see chart for details).     Patient presents with right foot pain following a fall.  He is otherwise nontoxic appearing.  X-rays obtained to evaluate for fractures.  X-rays pending at time of signout.  7:31 AM X-ray shows a proximal metatarsal fracture.  Weightbearing as tolerated in a walking boot.  Follow-up with orthopedics.  Final Clinical Impressions(s) / ED Diagnoses   Final diagnoses:  Right foot pain    ED Discharge Orders    None       Nicolet Griffy, Barbette Hair, MD 10/15/17 8101    Merryl Hacker, MD 10/15/17 [phone]

## 2017-10-17 DIAGNOSIS — S92351A Displaced fracture of fifth metatarsal bone, right foot, initial encounter for closed fracture: Secondary | ICD-10-CM | POA: Diagnosis not present

## 2017-11-27 DIAGNOSIS — M5412 Radiculopathy, cervical region: Secondary | ICD-10-CM | POA: Diagnosis not present

## 2017-11-27 DIAGNOSIS — M47812 Spondylosis without myelopathy or radiculopathy, cervical region: Secondary | ICD-10-CM | POA: Diagnosis not present

## 2017-11-27 DIAGNOSIS — Z6830 Body mass index (BMI) 30.0-30.9, adult: Secondary | ICD-10-CM | POA: Diagnosis not present

## 2017-11-27 DIAGNOSIS — M542 Cervicalgia: Secondary | ICD-10-CM | POA: Diagnosis not present

## 2017-11-27 DIAGNOSIS — I10 Essential (primary) hypertension: Secondary | ICD-10-CM | POA: Diagnosis not present

## 2017-12-03 ENCOUNTER — Ambulatory Visit (INDEPENDENT_AMBULATORY_CARE_PROVIDER_SITE_OTHER): Payer: Medicare Other | Admitting: *Deleted

## 2017-12-03 DIAGNOSIS — T63441D Toxic effect of venom of bees, accidental (unintentional), subsequent encounter: Secondary | ICD-10-CM | POA: Diagnosis not present

## 2017-12-25 DIAGNOSIS — C61 Malignant neoplasm of prostate: Secondary | ICD-10-CM | POA: Diagnosis not present

## 2018-01-04 DIAGNOSIS — C61 Malignant neoplasm of prostate: Secondary | ICD-10-CM | POA: Diagnosis not present

## 2018-01-04 DIAGNOSIS — N5201 Erectile dysfunction due to arterial insufficiency: Secondary | ICD-10-CM | POA: Diagnosis not present

## 2018-01-07 DIAGNOSIS — Z6830 Body mass index (BMI) 30.0-30.9, adult: Secondary | ICD-10-CM | POA: Diagnosis not present

## 2018-01-07 DIAGNOSIS — M5412 Radiculopathy, cervical region: Secondary | ICD-10-CM | POA: Diagnosis not present

## 2018-01-07 DIAGNOSIS — K219 Gastro-esophageal reflux disease without esophagitis: Secondary | ICD-10-CM | POA: Diagnosis not present

## 2018-01-07 DIAGNOSIS — Z1389 Encounter for screening for other disorder: Secondary | ICD-10-CM | POA: Diagnosis not present

## 2018-01-07 DIAGNOSIS — I10 Essential (primary) hypertension: Secondary | ICD-10-CM | POA: Diagnosis not present

## 2018-01-07 DIAGNOSIS — F439 Reaction to severe stress, unspecified: Secondary | ICD-10-CM | POA: Diagnosis not present

## 2018-01-14 DIAGNOSIS — I10 Essential (primary) hypertension: Secondary | ICD-10-CM | POA: Diagnosis not present

## 2018-01-28 ENCOUNTER — Ambulatory Visit: Payer: Medicare Other

## 2018-01-30 ENCOUNTER — Ambulatory Visit (INDEPENDENT_AMBULATORY_CARE_PROVIDER_SITE_OTHER): Payer: Medicare Other

## 2018-01-30 DIAGNOSIS — T63441D Toxic effect of venom of bees, accidental (unintentional), subsequent encounter: Secondary | ICD-10-CM

## 2018-01-30 NOTE — Progress Notes (Signed)
Patient will be out of town during the week he is due for his venom, not scheduling him for next injection.

## 2018-03-19 ENCOUNTER — Ambulatory Visit: Payer: Self-pay

## 2018-03-26 ENCOUNTER — Ambulatory Visit (INDEPENDENT_AMBULATORY_CARE_PROVIDER_SITE_OTHER): Payer: Medicare Other | Admitting: *Deleted

## 2018-03-26 DIAGNOSIS — T63441D Toxic effect of venom of bees, accidental (unintentional), subsequent encounter: Secondary | ICD-10-CM | POA: Diagnosis not present

## 2018-04-22 DIAGNOSIS — I10 Essential (primary) hypertension: Secondary | ICD-10-CM | POA: Diagnosis not present

## 2018-05-08 ENCOUNTER — Inpatient Hospital Stay (HOSPITAL_COMMUNITY)
Admission: EM | Admit: 2018-05-08 | Discharge: 2018-05-10 | DRG: 176 | Disposition: A | Discharge status: Home / Self Care | Payer: Medicare Other | Attending: Family Medicine | Admitting: Family Medicine

## 2018-05-08 ENCOUNTER — Encounter (HOSPITAL_COMMUNITY): Payer: Self-pay

## 2018-05-08 ENCOUNTER — Emergency Department (HOSPITAL_COMMUNITY): Payer: Medicare Other

## 2018-05-08 ENCOUNTER — Other Ambulatory Visit: Payer: Self-pay

## 2018-05-08 DIAGNOSIS — R0602 Shortness of breath: Secondary | ICD-10-CM | POA: Diagnosis not present

## 2018-05-08 DIAGNOSIS — I2699 Other pulmonary embolism without acute cor pulmonale: Secondary | ICD-10-CM | POA: Diagnosis not present

## 2018-05-08 DIAGNOSIS — H9190 Unspecified hearing loss, unspecified ear: Secondary | ICD-10-CM | POA: Diagnosis present

## 2018-05-08 DIAGNOSIS — N179 Acute kidney failure, unspecified: Secondary | ICD-10-CM | POA: Diagnosis not present

## 2018-05-08 DIAGNOSIS — Z86711 Personal history of pulmonary embolism: Secondary | ICD-10-CM

## 2018-05-08 DIAGNOSIS — C61 Malignant neoplasm of prostate: Secondary | ICD-10-CM | POA: Diagnosis present

## 2018-05-08 DIAGNOSIS — N200 Calculus of kidney: Secondary | ICD-10-CM | POA: Diagnosis not present

## 2018-05-08 DIAGNOSIS — K219 Gastro-esophageal reflux disease without esophagitis: Secondary | ICD-10-CM | POA: Diagnosis present

## 2018-05-08 DIAGNOSIS — Z86718 Personal history of other venous thrombosis and embolism: Secondary | ICD-10-CM

## 2018-05-08 DIAGNOSIS — N183 Chronic kidney disease, stage 3 (moderate): Secondary | ICD-10-CM | POA: Diagnosis not present

## 2018-05-08 DIAGNOSIS — Z8546 Personal history of malignant neoplasm of prostate: Secondary | ICD-10-CM | POA: Diagnosis not present

## 2018-05-08 DIAGNOSIS — I82441 Acute embolism and thrombosis of right tibial vein: Secondary | ICD-10-CM | POA: Diagnosis present

## 2018-05-08 DIAGNOSIS — Z888 Allergy status to other drugs, medicaments and biological substances status: Secondary | ICD-10-CM

## 2018-05-08 DIAGNOSIS — E860 Dehydration: Secondary | ICD-10-CM | POA: Diagnosis present

## 2018-05-08 DIAGNOSIS — Z881 Allergy status to other antibiotic agents status: Secondary | ICD-10-CM

## 2018-05-08 DIAGNOSIS — Z880 Allergy status to penicillin: Secondary | ICD-10-CM

## 2018-05-08 DIAGNOSIS — I82431 Acute embolism and thrombosis of right popliteal vein: Secondary | ICD-10-CM | POA: Diagnosis not present

## 2018-05-08 DIAGNOSIS — I129 Hypertensive chronic kidney disease with stage 1 through stage 4 chronic kidney disease, or unspecified chronic kidney disease: Secondary | ICD-10-CM | POA: Diagnosis present

## 2018-05-08 DIAGNOSIS — R1084 Generalized abdominal pain: Secondary | ICD-10-CM | POA: Diagnosis not present

## 2018-05-08 DIAGNOSIS — E785 Hyperlipidemia, unspecified: Secondary | ICD-10-CM | POA: Diagnosis present

## 2018-05-08 DIAGNOSIS — Z79899 Other long term (current) drug therapy: Secondary | ICD-10-CM

## 2018-05-08 DIAGNOSIS — J189 Pneumonia, unspecified organism: Secondary | ICD-10-CM | POA: Diagnosis present

## 2018-05-08 DIAGNOSIS — Z923 Personal history of irradiation: Secondary | ICD-10-CM

## 2018-05-08 DIAGNOSIS — J181 Lobar pneumonia, unspecified organism: Secondary | ICD-10-CM

## 2018-05-08 DIAGNOSIS — I10 Essential (primary) hypertension: Secondary | ICD-10-CM | POA: Diagnosis present

## 2018-05-08 DIAGNOSIS — Z7982 Long term (current) use of aspirin: Secondary | ICD-10-CM

## 2018-05-08 DIAGNOSIS — R310 Gross hematuria: Secondary | ICD-10-CM | POA: Diagnosis not present

## 2018-05-08 DIAGNOSIS — J168 Pneumonia due to other specified infectious organisms: Secondary | ICD-10-CM | POA: Diagnosis not present

## 2018-05-08 LAB — COMPREHENSIVE METABOLIC PANEL
ALBUMIN: 4.2 g/dL (ref 3.5–5.0)
ALT: 35 U/L (ref 0–44)
ANION GAP: 11 (ref 5–15)
AST: 37 U/L (ref 15–41)
Alkaline Phosphatase: 36 U/L — ABNORMAL LOW (ref 38–126)
BUN: 26 mg/dL — AB (ref 8–23)
CHLORIDE: 99 mmol/L (ref 98–111)
CO2: 27 mmol/L (ref 22–32)
Calcium: 10 mg/dL (ref 8.9–10.3)
Creatinine, Ser: 1.67 mg/dL — ABNORMAL HIGH (ref 0.61–1.24)
GFR calc Af Amer: 47 mL/min — ABNORMAL LOW (ref >60)
GFR, EST NON AFRICAN AMERICAN: 41 mL/min — AB (ref >60)
Glucose, Bld: 106 mg/dL — ABNORMAL HIGH (ref 70–99)
POTASSIUM: 4 mmol/L (ref 3.5–5.1)
Sodium: 137 mmol/L (ref 135–145)
Total Bilirubin: 0.6 mg/dL (ref 0.3–1.2)
Total Protein: 7.5 g/dL (ref 6.5–8.1)

## 2018-05-08 LAB — CBC WITH DIFFERENTIAL/PLATELET
BASOS ABS: 0 10*3/uL (ref 0.0–0.1)
BASOS PCT: 0 %
Eosinophils Absolute: 0.1 10*3/uL (ref 0.0–0.7)
Eosinophils Relative: 1 %
HEMATOCRIT: 38.8 % — AB (ref 39.0–52.0)
HEMOGLOBIN: 13.1 g/dL (ref 13.0–17.0)
LYMPHS PCT: 14 %
Lymphs Abs: 1.8 10*3/uL (ref 0.7–4.0)
MCH: 29.6 pg (ref 26.0–34.0)
MCHC: 33.8 g/dL (ref 30.0–36.0)
MCV: 87.8 fL (ref 78.0–100.0)
MONO ABS: 0.8 10*3/uL (ref 0.1–1.0)
Monocytes Relative: 6 %
NEUTROS ABS: 10.7 10*3/uL — AB (ref 1.7–7.7)
NEUTROS PCT: 79 %
Platelets: 251 10*3/uL (ref 150–400)
RBC: 4.42 MIL/uL (ref 4.22–5.81)
RDW: 13.1 % (ref 11.5–15.5)
WBC: 13.4 10*3/uL — ABNORMAL HIGH (ref 4.0–10.5)

## 2018-05-08 LAB — LIPASE, BLOOD: LIPASE: 24 U/L (ref 11–51)

## 2018-05-08 LAB — I-STAT CG4 LACTIC ACID, ED: Lactic Acid, Venous: 1.02 mmol/L (ref 0.5–1.9)

## 2018-05-08 MED ORDER — IOPAMIDOL (ISOVUE-370) INJECTION 76%
INTRAVENOUS | Status: AC
  Filled 2018-05-08: qty 100

## 2018-05-08 MED ORDER — SODIUM CHLORIDE 0.9 % IJ SOLN
INTRAMUSCULAR | Status: AC
  Filled 2018-05-08: qty 50

## 2018-05-08 MED ORDER — ACETAMINOPHEN 500 MG PO TABS
1000.0000 mg | ORAL_TABLET | Freq: Once | ORAL | Status: AC
  Administered 2018-05-08: 1000 mg via ORAL
  Filled 2018-05-08: qty 2

## 2018-05-08 MED ORDER — SODIUM CHLORIDE 0.9 % IV BOLUS
500.0000 mL | Freq: Once | INTRAVENOUS | Status: AC
Start: 2018-05-08 — End: 2018-05-09
  Administered 2018-05-08: 500 mL via INTRAVENOUS

## 2018-05-08 MED ORDER — LEVOFLOXACIN IN D5W 750 MG/150ML IV SOLN
750.0000 mg | Freq: Once | INTRAVENOUS | Status: AC
  Administered 2018-05-08: 750 mg via INTRAVENOUS
  Filled 2018-05-08: qty 150

## 2018-05-08 MED ORDER — IOPAMIDOL (ISOVUE-370) INJECTION 76%
80.0000 mL | Freq: Once | INTRAVENOUS | Status: AC | PRN
  Administered 2018-05-08: 80 mL via INTRAVENOUS

## 2018-05-08 NOTE — ED Provider Notes (Addendum)
[facility] DEPT Provider Note   CSN: [id number] Arrival date & time: 05/08/18  1943     History   Chief Complaint Chief Complaint  Patient presents with  . Flank Pain  . Back Pain  . Shortness of Breath    HPI [name] is a 67 y.o. male with a past medical history of SVT, hypertension, hyperlipidemia, GERD, frequent PVCs, PE/DVT after knee surgery, not currently anticoagulated, prostate cancer, who presents today for evaluation of flank pain, back pain, shortness of breath.  He started developing symptoms over the weekend and his right flank.  He reports that he has never had a kidney stone but based on the area of pain he was concerned that it was one.  He went to urology today who got a Noncon CT scan of his abdomen and pelvis.  The interpretation of this is unavailable in our system, however images are viewable.  He reports that his urologist told him if he develops fevers or nausea and vomiting that he needs to be seen again.  He reports that at home he had an oral temp of 101.  He has not taken any ibuprofen or tylenol today.    He reports feeling short of breath.  Of note he has had a DVT and PE in the past, this was triggered by a knee surgery, and he reports compliance with daily aspirin treatment.  He has not been sick recently, denies preceding cold, cough, or congestion.  HPI  Past Medical History:  Diagnosis Date  . 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 Dysrhythmia    PVC'S  . Frequent PVCs   . GERD (gastroesophageal reflux disease)   . H/O hiatal hernia   . Hearing loss   . Hyperlipemia   . Hypertension   . Paroxysmal SVT (supraventricular tachycardia) ([place])   . Prostate cancer ([place]) 01/13/14   Gleason 4+4=8  . S/P radiation therapy 06/17/2014 through 08/04/2014                                                      Prostate bed 6600 cGy in 33 sessions                            Patient Active Problem List   Diagnosis Date Noted  . 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 Malignant neoplasm of prostate ([place]) 02/11/2014  . Medial meniscus tear 07/03/2011  . HYPERLIPIDEMIA-MIXED 02/02/2009  . HYPERTENSION, UNSPECIFIED 02/02/2009  . CHEST PAIN UNSPECIFIED 02/02/2009  . PREMATURE VENTRICULAR CONTRACTIONS 12/28/2008  . PALPITATIONS 12/28/2008    Past Surgical History:  Procedure Laterality Date  . BACK SURGERY  2004   lumb lam  . COLONOSCOPY    . HERNIA REPAIR  2006   rt ing h  . KNEE SURGERY Left    arthroscopy  . LYMPHADENECTOMY Bilateral 03/19/2014   Procedure: LYMPHADENECTOMY;  Surgeon: Raynelle Bring, MD;  Location: [facility];  Service: Urology;  Laterality: Bilateral;  . PROSTATE BIOPSY  01/09/14   glaeson 4+4=8  . ROBOT ASSISTED LAPAROSCOPIC RADICAL PROSTATECTOMY N/A 03/19/2014   Procedure: ROBOTIC ASSISTED LAPAROSCOPIC RADICAL PROSTATECTOMY LEVEL 3;  Surgeon: Raynelle Bring, MD;  Location: [facility];  Service: Urology;  Laterality: N/A;        Home Medications    Prior to Admission medications   Medication Sig Start Date End Date Taking? Authorizing Provider  acetaminophen (TYLENOL) 325 MG tablet Take 650 mg by mouth every 6 (six) hours as needed for moderate pain.   Yes [provider]  aspirin 325 MG EC tablet Take 325 mg by mouth daily.   Yes [provider]  esomeprazole (NEXIUM) 40 MG capsule Take 40 mg by mouth daily before breakfast.     Yes [provider]  fenofibrate (TRICOR) 145 MG tablet Take 145 mg by mouth every morning.    Yes [provider]  metoprolol succinate (TOPROL-XL) 50 MG 24 hr tablet Take 50 mg by mouth every morning. Take with or immediately following a meal.   Yes [provider]  olmesartan-hydrochlorothiazide (BENICAR HCT) 40-12.5 MG tablet Take 1 tablet by mouth daily. 03/29/18  Yes [provider]  omega-3 acid ethyl esters (LOVAZA) 1 G capsule Take 1 g by mouth  daily.    Yes [provider]  simvastatin (ZOCOR) 40 MG tablet Take 40 mg by mouth every morning.    Yes [provider]  EPINEPHrine (EPIPEN 2-PAK) 0.3 mg/0.3 mL IJ SOAJ injection Inject 0.3 mLs (0.3 mg total) into the muscle once. 06/22/15   Kozlow, Donnamarie Poag, MD    Family History Family History  Problem Relation Age of Onset  . Heart disease Father   . Heart attack Father   . Dementia Mother        mild  . Colon cancer Maternal Grandmother     Social History Social History   Tobacco Use  . Smoking status: Never Smoker  . Smokeless tobacco: Never Used  Substance Use Topics  . Alcohol use: Yes    Comment: RARELY  . Drug use: No     Allergies   Clindamycin/lincomycin; Other; Penicillins; Polymyxin b; Venomil mixed vespid [mixed vespid venom]; and Venomil wasp venom [wasp venom]   Review of Systems Review of Systems  Constitutional: Positive for chills, fatigue and fever.  HENT: Negative for congestion.   Respiratory: Positive for shortness of breath. Negative for chest tightness and wheezing.   Cardiovascular: Negative for chest pain.  Gastrointestinal: Negative for abdominal pain, diarrhea, nausea and vomiting.  Genitourinary: Positive for flank pain. Negative for dysuria, frequency, hematuria and urgency.  All other systems reviewed and are negative.    Physical Exam Updated Vital Signs BP 136/76 (BP Location: Left Arm)   Pulse 86   Temp 99.2 F (37.3 C) (Oral)   Resp 16   SpO2 96%   Physical Exam  Constitutional: He appears well-developed and well-nourished.  HENT:  Head: Normocephalic and atraumatic.  Mouth/Throat: Oropharynx is clear and moist.  Eyes: Conjunctivae are normal.  Neck: Normal range of motion. Neck supple.  Cardiovascular: Normal rate and regular rhythm.  No murmur heard. Pulmonary/Chest: Effort normal. No accessory muscle usage. No respiratory distress. He has no decreased breath sounds. He has no wheezes. He has rhonchi  in the right middle field and the right lower field.  Abdominal: Soft. Bowel sounds are normal. He exhibits no distension. There is no tenderness. There is no guarding.  No CVA TTpercussion  Musculoskeletal: Normal range of motion. He exhibits no edema.       Right lower leg: Normal. He exhibits no tenderness and no edema.       Left lower leg: Normal. He exhibits no tenderness and no edema.  Neurological: He is alert.  Skin: Skin is warm and dry.  Psychiatric: He has a normal mood and affect.  Nursing note and vitals reviewed.    ED Treatments / Results  Labs (all labs ordered are listed, but only abnormal results are displayed) Labs Reviewed  COMPREHENSIVE METABOLIC PANEL - Abnormal; Notable for the following components:      Result Value   Glucose, Bld 106 (*)    BUN 26 (*)    Creatinine, Ser 1.67 (*)    Alkaline Phosphatase 36 (*)    GFR calc non Af Amer 41 (*)    GFR calc Af Amer 47 (*)    All other components within normal limits  CBC WITH DIFFERENTIAL/PLATELET - Abnormal; Notable for the following components:   WBC 13.4 (*)    HCT 38.8 (*)    Neutro Abs 10.7 (*)    All other components within normal limits   All other components within normal limits  CULTURE, BLOOD (ROUTINE X 2)  CULTURE, BLOOD (ROUTINE X 2)  I-STAT CG4 LACTIC ACID, ED    EKG EKG Interpretation  Date/Time:  Wednesday May 08 2018 20:08:22 EDT Ventricular Rate:  101 PR Interval:    QRS Duration: 92 QT Interval:  334 QTC Calculation: 433 R Axis:   20 Text Interpretation:  Age not entered, assumed to be  67 years old for purpose of ECG interpretation Sinus tachycardia Abnormal inferior Q waves similar to prior 7/15 Confirmed by Aletta Edouard [phone]) on 05/08/2018 8:57:18 PM   Radiology Dg Chest 2 View  Result Date: 05/08/2018 CLINICAL DATA:  Short of breath EXAM: CHEST - 2 VIEW COMPARISON:  03/06/2014 FINDINGS: Postsurgical changes in the cervical spine. No focal opacity or pleural  effusion. Normal heart size. No pneumothorax. Calcified granuloma/nodes in the left hilum. IMPRESSION: No active cardiopulmonary disease. Electronically Signed   By: Donavan Foil M.D.   On: 05/08/2018 20:33    Procedures Procedures (including critical care time)  Medications Ordered in ED Medications  levofloxacin (LEVAQUIN) IVPB 750 mg (750 mg Intravenous New Bag/Given 05/08/18 2206)  sodium chloride 0.9 % bolus 500 mL (500 mLs Intravenous New Bag/Given 05/08/18 2208)  acetaminophen (TYLENOL) tablet 1,000 mg (1,000 mg Oral Given 05/08/18 2130)     Initial Impression / Assessment and Plan / ED Course  I have reviewed the triage vital signs and the nursing notes.  Pertinent labs & imaging results that were available during my care of the patient were reviewed by me and considered in my medical decision making (see chart for details).  Clinical Course as of May 10 104  Wed May 08, 2018  2100 Spoke with Radiologist Dr. Kris Hartmann who read patient's CT abdomen/pelvis earlier   Pneumonia- infiltrate on right lung base, left sided calculi   [EH]  5533 67 year old male here with some right upper quadrant that turned into right flank pain over the last few days.  He saw his urologist and had a noncontrast scan that did not show an obvious kidney stone but he was then called later saying that there is a possible right lower lobe pneumonia.  He felt a little short of breath but there has not been any cough or fever until today.  He does have a history of blood clots and was on anticoagulation but not anymore.  We are checking some screening labs and likely will proceed with a CT PE if his creatinine was tolerated.  If he does not have a CT I think he  could be sent home on oral antibiotics as long as he is trending well off of oxygen.   [MB]  2357 Patient has multiple acute bilateral pulmonary emboli.  Will start heparin.  Will seek admission.  CT Angio Chest PE W/Cm &/Or Wo Cm [AM]  Thu May 09, 2018    0007 Evaluated at bedside.  Patient not tachypneic, hypoxic or tachycardic.  Resting comfortably.  Delivered results.   [AM]    Clinical Course User Index [AM] Albesa Seen, PA-C [EH] Lorin Glass, PA-C [MB] Hayden Rasmussen, MD   Patient presents today for evaluation of back pain.  He does have a remote history of PE after a DVT from leg surgery.  He was seen by urology today where he had a noncontrast CT abdomen and pelvis which did not show any obstructing stones or renal cause for his symptoms, however did show concern for pneumonia.  His history is not consistent with a pneumonia, he did not have a preceding cold.  He did develop a fever at home today.  Blood cultures were obtained.  He was charted on Levaquin for presumed pneumonia.  He does have a mild leukocytosis of 13.4.  His chest x-ray did not show any evidence of pneumonia, however given that he has what appears to be pneumonia on earlier CAT scan CT chest is necessary.  Given his history of blood clots, will they appear to have been provoked, he does not have a convincing history for pneumonia, therefore will scan as a CT PE angios study.  His d-dimer is not elevated, EKG was obtained and reviewed.  At shift change care was transferred to [facility] PA-C who will follow pending studies, re-evaulate and determine disposition.      Final Clinical Impressions(s) / ED Diagnoses   Final diagnoses:  Bilateral pulmonary embolism ([place])  Pneumonia of right lower lobe due to infectious organism [facility])  Shortness of breath    ED Discharge Orders    None       Ollen Gross 05/10/18 0109    Hayden Rasmussen, MD 05/10/18 1722   Addendum 05/16/18 CRITICAL CARE Performed by: Wyn Quaker Total critical care time: 30 minutes Critical care time was exclusive of separately billable procedures and treating other patients. Critical care was necessary to treat or prevent imminent or  life-threatening deterioration. Critical care was time spent personally by me on the following activities: development of treatment plan with patient and/or surrogate as well as nursing, discussions with consultants, evaluation of patient's response to treatment, examination of patient, obtaining history from patient or surrogate, ordering and performing treatments and interventions, ordering and review of laboratory studies, ordering and review of radiographic studies, pulse oximetry and re-evaluation of patient's condition.  New PE requiring heparin, admission.      Lorin Glass, PA-C 05/16/18 0430    Hayden Rasmussen, MD 05/16/18 [phone]

## 2018-05-08 NOTE — ED Triage Notes (Signed)
Pt here for severe  Right sided flank pain. Pt was seen at urology today and CT was negative. He was told to come to the ED if he had a fever and he reports having a fever of 101. Pt also reports having pain that radiates to his right abd and SOB with exertion.

## 2018-05-09 ENCOUNTER — Inpatient Hospital Stay (HOSPITAL_COMMUNITY): Payer: Medicare Other

## 2018-05-09 DIAGNOSIS — E860 Dehydration: Secondary | ICD-10-CM | POA: Diagnosis not present

## 2018-05-09 DIAGNOSIS — Z86718 Personal history of other venous thrombosis and embolism: Secondary | ICD-10-CM | POA: Diagnosis not present

## 2018-05-09 DIAGNOSIS — Z881 Allergy status to other antibiotic agents status: Secondary | ICD-10-CM | POA: Diagnosis not present

## 2018-05-09 DIAGNOSIS — J181 Lobar pneumonia, unspecified organism: Secondary | ICD-10-CM

## 2018-05-09 DIAGNOSIS — Z7982 Long term (current) use of aspirin: Secondary | ICD-10-CM | POA: Diagnosis not present

## 2018-05-09 DIAGNOSIS — I82441 Acute embolism and thrombosis of right tibial vein: Secondary | ICD-10-CM | POA: Diagnosis not present

## 2018-05-09 DIAGNOSIS — Z79899 Other long term (current) drug therapy: Secondary | ICD-10-CM | POA: Diagnosis not present

## 2018-05-09 DIAGNOSIS — N179 Acute kidney failure, unspecified: Secondary | ICD-10-CM | POA: Diagnosis not present

## 2018-05-09 DIAGNOSIS — I82431 Acute embolism and thrombosis of right popliteal vein: Secondary | ICD-10-CM | POA: Diagnosis not present

## 2018-05-09 DIAGNOSIS — E785 Hyperlipidemia, unspecified: Secondary | ICD-10-CM | POA: Diagnosis not present

## 2018-05-09 DIAGNOSIS — I10 Essential (primary) hypertension: Secondary | ICD-10-CM

## 2018-05-09 DIAGNOSIS — I2699 Other pulmonary embolism without acute cor pulmonale: Principal | ICD-10-CM

## 2018-05-09 DIAGNOSIS — J189 Pneumonia, unspecified organism: Secondary | ICD-10-CM | POA: Diagnosis present

## 2018-05-09 DIAGNOSIS — Z888 Allergy status to other drugs, medicaments and biological substances status: Secondary | ICD-10-CM | POA: Diagnosis not present

## 2018-05-09 DIAGNOSIS — K219 Gastro-esophageal reflux disease without esophagitis: Secondary | ICD-10-CM | POA: Diagnosis not present

## 2018-05-09 DIAGNOSIS — Z880 Allergy status to penicillin: Secondary | ICD-10-CM | POA: Diagnosis not present

## 2018-05-09 DIAGNOSIS — N183 Chronic kidney disease, stage 3 (moderate): Secondary | ICD-10-CM | POA: Diagnosis not present

## 2018-05-09 DIAGNOSIS — C61 Malignant neoplasm of prostate: Secondary | ICD-10-CM

## 2018-05-09 DIAGNOSIS — Z8546 Personal history of malignant neoplasm of prostate: Secondary | ICD-10-CM | POA: Diagnosis not present

## 2018-05-09 DIAGNOSIS — Z923 Personal history of irradiation: Secondary | ICD-10-CM | POA: Diagnosis not present

## 2018-05-09 DIAGNOSIS — Z86711 Personal history of pulmonary embolism: Secondary | ICD-10-CM | POA: Diagnosis not present

## 2018-05-09 DIAGNOSIS — I129 Hypertensive chronic kidney disease with stage 1 through stage 4 chronic kidney disease, or unspecified chronic kidney disease: Secondary | ICD-10-CM | POA: Diagnosis not present

## 2018-05-09 DIAGNOSIS — H9190 Unspecified hearing loss, unspecified ear: Secondary | ICD-10-CM | POA: Diagnosis present

## 2018-05-09 LAB — COMPREHENSIVE METABOLIC PANEL
ALK PHOS: 37 U/L — AB (ref 38–126)
ALT: 43 U/L (ref 0–44)
AST: 47 U/L — ABNORMAL HIGH (ref 15–41)
Albumin: 4.1 g/dL (ref 3.5–5.0)
Anion gap: 9 (ref 5–15)
BUN: 24 mg/dL — ABNORMAL HIGH (ref 8–23)
CALCIUM: 9.5 mg/dL (ref 8.9–10.3)
CO2: 24 mmol/L (ref 22–32)
CREATININE: 1.54 mg/dL — AB (ref 0.61–1.24)
Chloride: 104 mmol/L (ref 98–111)
GFR, EST AFRICAN AMERICAN: 52 mL/min — AB (ref >60)
GFR, EST NON AFRICAN AMERICAN: 45 mL/min — AB (ref >60)
Glucose, Bld: 103 mg/dL — ABNORMAL HIGH (ref 70–99)
Potassium: 3.9 mmol/L (ref 3.5–5.1)
Sodium: 137 mmol/L (ref 135–145)
Total Bilirubin: 1 mg/dL (ref 0.3–1.2)
Total Protein: 7.4 g/dL (ref 6.5–8.1)

## 2018-05-09 LAB — URINALYSIS, ROUTINE W REFLEX MICROSCOPIC
BILIRUBIN URINE: NEGATIVE
Glucose, UA: NEGATIVE mg/dL
Ketones, ur: NEGATIVE mg/dL
Leukocytes, UA: NEGATIVE
Nitrite: NEGATIVE
PH: 6 (ref 5.0–8.0)
Protein, ur: NEGATIVE mg/dL
SPECIFIC GRAVITY, URINE: 1.03 (ref 1.005–1.030)

## 2018-05-09 LAB — HEPARIN LEVEL (UNFRACTIONATED)
HEPARIN UNFRACTIONATED: 0.37 [IU]/mL (ref 0.30–0.70)
Heparin Unfractionated: 0.29 IU/mL — ABNORMAL LOW (ref 0.30–0.70)

## 2018-05-09 LAB — HIV ANTIBODY (ROUTINE TESTING W REFLEX): HIV SCREEN 4TH GENERATION: NONREACTIVE

## 2018-05-09 LAB — CBC
HEMATOCRIT: 38 % — AB (ref 39.0–52.0)
HEMOGLOBIN: 12.6 g/dL — AB (ref 13.0–17.0)
MCH: 29.3 pg (ref 26.0–34.0)
MCHC: 33.2 g/dL (ref 30.0–36.0)
MCV: 88.4 fL (ref 78.0–100.0)
Platelets: 239 10*3/uL (ref 150–400)
RBC: 4.3 MIL/uL (ref 4.22–5.81)
RDW: 13.2 % (ref 11.5–15.5)
WBC: 11.7 10*3/uL — AB (ref 4.0–10.5)

## 2018-05-09 LAB — APTT: APTT: 28 s (ref 24–36)

## 2018-05-09 LAB — MAGNESIUM: Magnesium: 1.9 mg/dL (ref 1.7–2.4)

## 2018-05-09 LAB — ECHOCARDIOGRAM COMPLETE
Height: 75 in
Weight: 3696 oz

## 2018-05-09 LAB — STREP PNEUMONIAE URINARY ANTIGEN: Strep Pneumo Urinary Antigen: NEGATIVE

## 2018-05-09 LAB — PROTIME-INR
INR: 1.05
PROTHROMBIN TIME: 13.6 s (ref 11.4–15.2)

## 2018-05-09 LAB — PROCALCITONIN: Procalcitonin: <0.1 ng/mL

## 2018-05-09 LAB — TROPONIN I: Troponin I: <0.03 ng/mL (ref <0.03)

## 2018-05-09 MED ORDER — ONDANSETRON HCL 4 MG PO TABS: 4.0000 mg | ORAL_TABLET | Freq: Four times a day (QID) | ORAL | Status: DC | PRN

## 2018-05-09 MED ORDER — PANTOPRAZOLE SODIUM 40 MG PO TBEC
40.0000 mg | DELAYED_RELEASE_TABLET | Freq: Every day | ORAL | Status: DC
  Administered 2018-05-09 – 2018-05-10 (×2): 40 mg via ORAL
  Filled 2018-05-09 (×2): qty 1

## 2018-05-09 MED ORDER — LEVOFLOXACIN 750 MG PO TABS: 750.0000 mg | ORAL_TABLET | ORAL | Status: DC

## 2018-05-09 MED ORDER — BISACODYL 5 MG PO TBEC
5.0000 mg | DELAYED_RELEASE_TABLET | Freq: Every day | ORAL | Status: DC | PRN
  Administered 2018-05-09: 5 mg via ORAL
  Filled 2018-05-09: qty 1

## 2018-05-09 MED ORDER — SODIUM CHLORIDE 0.9 % IV SOLN
INTRAVENOUS | Status: AC
  Administered 2018-05-09 (×2): via INTRAVENOUS

## 2018-05-09 MED ORDER — ACETAMINOPHEN 650 MG RE SUPP: 650.0000 mg | Freq: Four times a day (QID) | RECTAL | Status: DC | PRN

## 2018-05-09 MED ORDER — HYDROCODONE-ACETAMINOPHEN 5-325 MG PO TABS
1.0000 | ORAL_TABLET | ORAL | Status: DC | PRN
  Administered 2018-05-09: 2 via ORAL
  Administered 2018-05-09: 1 via ORAL
  Administered 2018-05-09 (×2): 2 via ORAL
  Filled 2018-05-09 (×2): qty 2
  Filled 2018-05-09: qty 1
  Filled 2018-05-09: qty 2

## 2018-05-09 MED ORDER — HEPARIN (PORCINE) IN NACL 100-0.45 UNIT/ML-% IJ SOLN
1900.0000 [IU]/h | INTRAMUSCULAR | Status: DC
  Administered 2018-05-09: 1600 [IU]/h via INTRAVENOUS
  Administered 2018-05-10: 1900 [IU]/h via INTRAVENOUS
  Filled 2018-05-09 (×2): qty 250

## 2018-05-09 MED ORDER — HEPARIN (PORCINE) IN NACL 100-0.45 UNIT/ML-% IJ SOLN
1500.0000 [IU]/h | INTRAMUSCULAR | Status: DC
  Administered 2018-05-09: 1500 [IU]/h via INTRAVENOUS
  Filled 2018-05-09 (×2): qty 250

## 2018-05-09 MED ORDER — METOPROLOL SUCCINATE ER 50 MG PO TB24
50.0000 mg | ORAL_TABLET | Freq: Every day | ORAL | Status: DC
  Administered 2018-05-09 – 2018-05-10 (×2): 50 mg via ORAL
  Filled 2018-05-09 (×2): qty 1

## 2018-05-09 MED ORDER — ONDANSETRON HCL 4 MG/2ML IJ SOLN: 4.0000 mg | Freq: Four times a day (QID) | INTRAMUSCULAR | Status: DC | PRN

## 2018-05-09 MED ORDER — ACETAMINOPHEN 500 MG PO TABS
1000.0000 mg | ORAL_TABLET | Freq: Four times a day (QID) | ORAL | Status: DC | PRN
  Administered 2018-05-10 (×2): 1000 mg via ORAL
  Filled 2018-05-09 (×2): qty 2

## 2018-05-09 MED ORDER — ACETAMINOPHEN 325 MG PO TABS
650.0000 mg | ORAL_TABLET | Freq: Four times a day (QID) | ORAL | Status: DC | PRN
  Administered 2018-05-09: 650 mg via ORAL
  Filled 2018-05-09: qty 2

## 2018-05-09 MED ORDER — FENOFIBRATE 160 MG PO TABS
160.0000 mg | ORAL_TABLET | Freq: Every day | ORAL | Status: DC
  Administered 2018-05-09 – 2018-05-10 (×2): 160 mg via ORAL
  Filled 2018-05-09 (×2): qty 1

## 2018-05-09 MED ORDER — SIMVASTATIN 40 MG PO TABS
40.0000 mg | ORAL_TABLET | Freq: Every day | ORAL | Status: DC
  Administered 2018-05-09: 40 mg via ORAL
  Filled 2018-05-09: qty 1

## 2018-05-09 MED ORDER — HEPARIN BOLUS VIA INFUSION
5000.0000 [IU] | Freq: Once | INTRAVENOUS | Status: AC
  Administered 2018-05-09: 5000 [IU] via INTRAVENOUS
  Filled 2018-05-09: qty 5000

## 2018-05-09 NOTE — Progress Notes (Signed)
[name]   Patient is admitted after midnight see H&P for full details.  Patient seen, chart reviewed and plan of care discussed.  Patient is a 67 year old male with medical history significant for hypertension, prostate cancer on remission, history of SVT and prior DVT post surgical procedure.  Patient presented to the emergency department complaining of back pain and shortness of breath.  Upon initial evaluation CTA of the chest showed multiple bilateral PE and possible right lower lobe pneumonia.  She was admitted with working diagnosis of pulmonary embolism and started on heparin drip.  Patient reports feeling better today.  Will continue heparin drip for 24 hours, transition to NOAC in a.m. if continue to be stable.  Patient was started on antibiotic for possible pneumonia, will obtain procalcitonin if negative can discontinue Levaquin.  I have ordered echocardiogram and follow lower extremity Doppler. Will continue to monitor.   Chipper Oman, MD

## 2018-05-09 NOTE — Progress Notes (Signed)
Bilateral lower extremity venous duplex completed - preliminary results - Right Positive for an acute DVT coursing from the posterior tibial vein to the mid popliteal.  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

## 2018-05-09 NOTE — Progress Notes (Signed)
[place] for IV heparin Indication: pulmonary embolus  Allergies  Allergen Reactions  . Clindamycin/Lincomycin Diarrhea  . Other     The gel used for ultrasound  Aqua sonic 100 - caused rash  The ekg electrodes cause skin irritation that last for weeks Allergic to bees and wasps  . Penicillins Hives  . Polymyxin B Hives  . Venomil Mixed Vespid [Mixed Vespid Venom]   . Venomil Wasp Venom [Wasp Venom]    Patient Measurements: Height: 6\' 3"  (190.5 cm) Weight: 231 lb (104.8 kg) IBW/kg (Calculated) : 84.5 Heparin Dosing Weight: 91 kg  Vital Signs: Temp: 99.2 F (37.3 C) (10/03 2001) Temp Source: Oral (10/03 2001) BP: 131/77 (10/03 2001) Pulse Rate: 92 (10/03 2001)  Labs: Recent Labs    05/08/18 2150 05/09/18 0033 05/09/18 0710 05/09/18 0715 05/09/18 2020  HGB 13.1  --  12.6*  --   --   HCT 38.8*  --  38.0*  --   --   PLT 251  --  239  --   --   APTT  --  28  --   --   --   LABPROT  --  13.6  --   --   --   INR  --  1.05  --   --   --   HEPARINUNFRC  --   --   --  0.37 0.29*  CREATININE 1.67*  --  1.54*  --   --   TROPONINI  --  <0.03  --   --   --    Estimated Creatinine Clearance: 61 mL/min (A) (by C-G formula based on SCr of 1.54 mg/dL (H)).  Assessment: 40 yoM c/o right sided flank pain found to have multiple small bilateral PEs. Pharmacy asked to dose IV heparin for PE.  Baseline labs: aptt= 28, INR=1.05 and CBC WNL  Per MD note plan is 24 hrs UFH then transition to [place] if remains stable.  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   Today, 05/09/2018  First heparin level therapeutic at 0.37 after 5000 unit bolus and drip at 1500 units/hr.  Hg 12.6, PTLC WNL, no bleeding reported Increase heparin drip to 1600 units/hr  Second Heparin level decreased at 0.29 units/ml, slightly subtherapeutic, despite empiric rate increase, and large bolus  Goal of Therapy:  Heparin level 0.3-0.7  units/ml Monitor platelets by anticoagulation protocol: Yes   Plan:  Increase Heparin to 1900 units/hr Recheck Heparin level in 8 hr Daily CBC No Daily Hep level yet F/u transition to DOAC - was on Xarelto in the past  Minda Ditto PharmD Pager [phone] 05/09/2018, 9:30 PM

## 2018-05-09 NOTE — Progress Notes (Signed)
ANTICOAGULATION CONSULT NOTE - Initial Consult  Pharmacy Consult for IV heparin Indication: pulmonary embolus  Allergies  Allergen Reactions  . Clindamycin/Lincomycin Diarrhea  . Other     The gel used for ultrasound  Aqua sonic 100 - caused rash  The ekg electrodes cause skin irritation that last for weeks Allergic to bees and wasps  . Penicillins Hives  . Polymyxin B Hives  . Venomil Mixed Vespid [Mixed Vespid Venom]   . Venomil Wasp Venom [Wasp Venom]     Patient Measurements: Height: 6\' 3"  (190.5 cm) Weight: 230 lb (104.3 kg) IBW/kg (Calculated) : 84.5 Heparin Dosing Weight: 91 kg  Vital Signs: Temp: 99.2 F (37.3 C) (10/02 2000) Temp Source: Oral (10/02 2000) BP: 132/71 (10/03 0000) Pulse Rate: 90 (10/03 0000)  Labs: Recent Labs    05/08/18 2150  HGB 13.1  HCT 38.8*  PLT 251  CREATININE 1.67*    Estimated Creatinine Clearance: 56.1 mL/min (A) (by C-G formula based on SCr of 1.67 mg/dL (H)).   Medical History: Past Medical History:  Diagnosis Date  . 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 Dysrhythmia    PVC'S  . Frequent PVCs   . GERD (gastroesophageal reflux disease)   . H/O hiatal hernia   . Hearing loss   . Hyperlipemia   . Hypertension   . Paroxysmal SVT (supraventricular tachycardia) ([place])   . Prostate cancer ([place]) 01/13/14   Gleason 4+4=8  . S/P radiation therapy 06/17/2014 through 08/04/2014                                                      Prostate bed 6600 cGy in 33 sessions                           Medications:  Scheduled:  . heparin  5,000 Units Intravenous Once  . iopamidol      . sodium chloride       Infusions:  . heparin      Assessment: 14 yoM c/o right sided flank pain found to have multiple small bilateral PEs. Pharmacy asked to dose IV heparin for PE.  Baseline labs: aptt= 28, INR=1.05 and CBC WNL Goal of Therapy:  Heparin level 0.3-0.7  units/ml Monitor platelets by anticoagulation protocol: Yes   Plan:  Baseline aPtt and PT/INR STAT Heparin 5000 unit bolus x1 now Start drip at 1500 units/hr Daily CBC/HL Check 1st HL in 6 hours  Lawana Pai R 05/09/2018,12:13 AM

## 2018-05-09 NOTE — ED Notes (Signed)
ED TO INPATIENT HANDOFF REPORT  Name/Age/Gender [name] 67 y.o. male  Code Status Code Status History    Date Active Date Inactive Code Status Order ID Comments User Context   03/19/2014 1355 03/20/2014 1536 Full Code [id number]  Marcie Bal, PA-C Inpatient    Advance Directive Documentation     Most Recent Value  Type of Advance Directive  Healthcare Power of Attorney, Living will  Pre-existing out of facility DNR order (yellow form or pink MOST form)  -  "MOST" Form in Place?  -      Home/SNF/Other Home  Chief Complaint Severe back pains, fever  Level of Care/Admitting Diagnosis ED Disposition    ED Disposition Condition [place]: [place] [[id number]]  Level of Care: Telemetry [5]  Admit to tele based on following criteria: Other see comments  Comments: pulmonary embolism  Diagnosis: Pulmonary embolism ([place]) [[id number]]  Admitting Physician: Toy Baker [[id number]]  Attending Physician: Toy Baker [[id number]]  Estimated length of stay: past midnight tomorrow  Certification:: I certify this patient will need inpatient services for at least 2 midnights  PT Class (Do Not Modify): Inpatient [101]  PT Acc Code (Do Not Modify): Private [1]       Medical History Past Medical History:  Diagnosis Date  . 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 Dysrhythmia    PVC'S  . Frequent PVCs   . GERD (gastroesophageal reflux disease)   . H/O hiatal hernia   . Hearing loss   . Hyperlipemia   . Hypertension   . Paroxysmal SVT (supraventricular tachycardia) ([place])   . Prostate cancer ([place]) 01/13/14   Gleason 4+4=8  . S/P radiation therapy 06/17/2014 through 08/04/2014                                                      Prostate bed 6600 cGy in 33 sessions                           Allergies Allergies  Allergen Reactions  . Clindamycin/Lincomycin  Diarrhea  . Other     The gel used for ultrasound  Aqua sonic 100 - caused rash  The ekg electrodes cause skin irritation that last for weeks Allergic to bees and wasps  . Penicillins Hives  . Polymyxin B Hives  . Venomil Mixed Vespid [Mixed Vespid Venom]   . Venomil Wasp Venom [Wasp Venom]     IV Location/Drains/Wounds Patient Lines/Drains/Airways Status   Active Line/Drains/Airways    Name:   Placement date:   Placement time:   Site:   Days:   Peripheral IV 05/08/18 Left Arm   05/08/18    2151    Arm   1   Urethral Catheter L. Burnham RN Coude 18 Fr.   03/19/14    0800    Cou[place]   1512   Airway 7.5 mm   03/19/14    0732     1512   Incision - 6 Ports Abdomen Left;Lateral;Lower Left;Lateral;Upper Umbilicus;Superior Right;Lateral;Lower Right;Medial;Upper Right;Lateral;Upper   03/19/14    0800     1512          Labs/Imaging Results for orders placed  or performed during the hospital encounter of 05/08/18 (from the past 48 hour(s))  Comprehensive metabolic panel     Status: Abnormal   Collection Time: 05/08/18  9:50 PM  Result Value Ref Range   Sodium 137 135 - 145 mmol/L   Potassium 4.0 3.5 - 5.1 mmol/L   Chloride 99 98 - 111 mmol/L   CO2 27 22 - 32 mmol/L   Glucose, Bld 106 (H) 70 - 99 mg/dL   BUN 26 (H) 8 - 23 mg/dL   Creatinine, Ser 1.67 (H) 0.61 - 1.24 mg/dL   Calcium 10.0 8.9 - 10.3 mg/dL   Total Protein 7.5 6.5 - 8.1 g/dL   Albumin 4.2 3.5 - 5.0 g/dL   AST 37 15 - 41 U/L   ALT 35 0 - 44 U/L   Alkaline Phosphatase 36 (L) 38 - 126 U/L   Total Bilirubin 0.6 0.3 - 1.2 mg/dL   GFR calc non Af Amer 41 (L) >60 mL/min   GFR calc Af Amer 47 (L) >60 mL/min    Comment: (NOTE) The eGFR has been calculated using the CKD EPI equation. This calculation has not been validated in all clinical situations. eGFR's persistently <60 mL/min signify possible Chronic Kidney Disease.    Anion gap 11 5 - 15    Comment: Performed at [facility], [place] [address],  [place], [place] [address]  Lipase, blood     Status: None   Collection Time: 05/08/18  9:50 PM  Result Value Ref Range   Lipase 24 11 - 51 U/L    Comment: Performed at [facility], [place] [address] [place], [place] [address]  CBC with Differential     Status: Abnormal   Collection Time: 05/08/18  9:50 PM  Result Value Ref Range   WBC 13.4 (H) 4.0 - 10.5 K/uL   RBC 4.42 4.22 - 5.81 MIL/uL   Hemoglobin 13.1 13.0 - 17.0 g/dL   HCT 38.8 (L) 39.0 - 52.0 %   MCV 87.8 78.0 - 100.0 fL   MCH 29.6 26.0 - 34.0 pg   MCHC 33.8 30.0 - 36.0 g/dL   RDW 13.1 11.5 - 15.5 %   Platelets 251 150 - 400 K/uL   Neutrophils Relative % 79 %   Neutro Abs 10.7 (H) 1.7 - 7.7 K/uL   Lymphocytes Relative 14 %   Lymphs Abs 1.8 0.7 - 4.0 K/uL   Monocytes Relative 6 %   Monocytes Absolute 0.8 0.1 - 1.0 K/uL   Eosinophils Relative 1 %   Eosinophils Absolute 0.1 0.0 - 0.7 K/uL   Basophils Relative 0 %   Basophils Absolute 0.0 0.0 - 0.1 K/uL    Comment: Performed at [facility], [place] [address], [place], [place] [address]  I-Stat CG4 Lactic Acid, ED     Status: None   Collection Time: 05/08/18  9:55 PM  Result Value Ref Range   Lactic Acid, Venous 1.02 0.5 - 1.9 mmol/L  Urinalysis, Routine w reflex microscopic     Status: Abnormal   Collection Time: 05/09/18 12:33 AM  Result Value Ref Range   Color, Urine YELLOW YELLOW   APPearance CLEAR CLEAR   Specific Gravity, Urine 1.030 1.005 - 1.030   pH 6.0 5.0 - 8.0   Glucose, UA NEGATIVE NEGATIVE mg/dL   Hgb urine dipstick MODERATE (A) NEGATIVE   Bilirubin Urine NEGATIVE NEGATIVE   Ketones, ur NEGATIVE NEGATIVE mg/dL   Protein, ur NEGATIVE NEGATIVE mg/dL  Nitrite NEGATIVE NEGATIVE   Leukocytes, UA NEGATIVE NEGATIVE   RBC / HPF 0-5 0 - 5 RBC/hpf   WBC, UA 0-5 0 - 5 WBC/hpf   Bacteria, UA RARE (A) NONE SEEN   Mucus PRESENT    Hyaline Casts, UA PRESENT     Comment: Performed at [facility], [place] [address] [place], [place] [address]   Dg Chest 2 View  Result Date: 05/08/2018 CLINICAL DATA:  Short of breath EXAM: CHEST - 2 VIEW COMPARISON:  03/06/2014 FINDINGS: Postsurgical changes in the cervical spine. No focal opacity or pleural effusion. Normal heart size. No pneumothorax. Calcified granuloma/nodes in the left hilum. IMPRESSION: No active cardiopulmonary disease. Electronically Signed   By: Donavan Foil M.D.   On: 05/08/2018 20:33   Ct Angio Chest Pe W/cm &/or Wo Cm  Result Date: 05/08/2018 CLINICAL DATA:  Right-sided chest pain EXAM: CT ANGIOGRAPHY CHEST WITH CONTRAST TECHNIQUE: Multidetector CT imaging of the chest was performed using the standard protocol during bolus administration of intravenous contrast. Multiplanar CT image reconstructions and MIPs were obtained to evaluate the vascular anatomy. CONTRAST:  45m ISOVUE-370 IOPAMIDOL (ISOVUE-370) INJECTION 76% COMPARISON:  Chest x-ray 05/08/2018 FINDINGS: Cardiovascular: Satisfactory opacification of the pulmonary arteries to the segmental level. Multiple small filling defects within segmental and subsegmental branch vessels of the right middle lobe, subsegmental right lower lobe, and segmental and subsegmental branches of the left lower and upper lobe lobe pulmonary artery consistent with acute emboli. No significant pericardial effusion. Nonaneurysmal aorta. Mild aortic atherosclerosis. Borderline cardiomegaly. Mediastinum/Nodes: Calcified mediastinal and left hilar nodes. Midline trachea. Subcentimeter hypodense lesion left lobe of thyroid inferiorly. Esophagus within normal limits Lungs/Pleura: Ground-glass density in the posterior right lower lobe. No pneumothorax. Trace right pleural effusion Upper Abdomen: Cyst in the upper pole of the left kidney. No acute abnormality. Musculoskeletal: No chest wall abnormality. No acute or significant osseous findings. Review of the MIP images confirms the above findings. IMPRESSION: 1. Positive for multiple  small acute bilateral pulmonary emboli within segmental and subsegmental branches. 2. Ground-glass density in the posterior right lower lobe with adjacent small effusion, favored to represent pneumonia as opposed to infarct given lack of substantial emboli in this distribution. 3. Prior granulomatous disease Critical Value/emergent results were called by telephone at the time of interpretation on 05/08/2018 at 11:50 pm to Dr. MRaul Delwho took results for Alyssa , who verbally acknowledged these results. Aortic Atherosclerosis (ICD10-I70.0). Electronically Signed   By: KDonavan FoilM.D.   On: 05/08/2018 23:51    Pending Labs Unresulted Labs (From admission, onward)    Start     Ordered   05/09/18 0014  Brain natriuretic peptide  Add-on,   R     05/09/18 0013   05/09/18 0013  Troponin I (q 6hr x 3)  Now then every 6 hours,   R     05/09/18 0013   05/09/18 0008  APTT  STAT,   R     05/09/18 0007   05/09/18 0008  Protime-INR  STAT,   R     05/09/18 0007   05/08/18 2118  Culture, blood (routine x 2)  BLOOD CULTURE X 2,   STAT     05/08/18 2117   Signed and Held  HIV antibody (Routine Screening)  Tomorrow morning,   R     Signed and Held   Signed and Held  Culture, sputum-assessment  Once,   R     Signed and Held   Signed  and Held  Gram stain  Once,   R     Signed and Held   Signed and Held  Strep pneumoniae urinary antigen  Once,   R     Signed and Held   Signed and Held  Magnesium  Tomorrow morning,   R    Comments:  Call MD if <1.5    Signed and Held   Signed and Held  Phosphorus  Tomorrow morning,   R     Signed and Held   Signed and Held  TSH  Once,   R    Comments:  Cancel if already done within 1 month and notify MD    Signed and Held   Signed and Held  Comprehensive metabolic panel  Once,   R    Comments:  Cal MD for K<3.5 or >5.0    Signed and Held   Signed and Held  CBC  Once,   R    Comments:  Call for hg <8.0    Signed and Held          Vitals/Pain Today's Vitals    05/09/18 0000 05/09/18 0000 05/09/18 0001 05/09/18 0032  BP: 132/71 132/71  132/71  Pulse: 90 90  84  Resp: (!) _0 Temp:      TempSrc:      SpO2: 95% 95%  92%  Weight:      Height:      PainSc:   0-No pain     Isolation Precautions No active isolations  Medications Medications  sodium chloride 0.9 % injection (has no administration in time range)  iopamidol (ISOVUE-370) 76 % injection (has no administration in time range)  heparin ADULT infusion 100 units/mL (25000 units/27m sodium chloride 0.45%) (1,500 Units/hr Intravenous New Bag/Given 05/09/18 0031)  acetaminophen (TYLENOL) tablet 1,000 mg (1,000 mg Oral Given 05/08/18 2130)  levofloxacin (LEVAQUIN) IVPB 750 mg (0 mg Intravenous Stopped 05/08/18 2336)  sodium chloride 0.9 % bolus 500 mL (0 mLs Intravenous Stopped 05/09/18 0002)  iopamidol (ISOVUE-370) 76 % injection 80 mL (80 mLs Intravenous Contrast Given 05/08/18 2312)  heparin bolus via infusion 5,000 Units (5,000 Units Intravenous Bolus from Bag 05/09/18 0028)    Mobility walks

## 2018-05-09 NOTE — H&P (Signed)
[name] IOE:[id number] DOB: 27-Jun-1951 DOA: 05/08/2018     PCP: Prince Solian, MD   Outpatient Specialists: Urology Dr. Alinda Money  Patient arrived to ER on 05/08/18 at 1943  Patient coming from: home Lives  With family   Chief Complaint:  Chief Complaint  Patient presents with  . Flank Pain  . Back Pain  . Shortness of Breath    HPI: [name] is a 67 y.o. male with medical history significant of GERD, hypertension, HLD prostate cancer history of SVT, history of PE DVTs after knee surgery.    Presented with severe right-sided flank pain was seen by urology today initial CT of abdomen and pelvis negative developed a fever up to 101 and was told to present to emergency department Patient also endorses shortness of breath. No sick contacts No chest pain but right flank pain, no travel hx.  Reported he had a cramp in his right leg similar to prior DVT improved after 1 hour.   Regarding pertinent Chronic problems: 3 of prostate cancer diagnosed in 2015 status post robotic assisted laparoscopic radical prostatectomy and radiation therapy  Hx of DVT/ PE 2014 in association with knee surgery  Was on Xarelto now on Aspirin only    While in ER:  The following Work up has been ordered so far:  Orders Placed This Encounter  Procedures  . Culture, blood (routine x 2)  . DG Chest 2 View  . CT Angio Chest PE W/Cm &/Or Wo Cm  . Comprehensive metabolic panel  . Lipase, blood  . CBC with Differential  . If O2 Sat <94% administer O2 at 2 liters/minute via nasal cannula  . heparin per pharmacy consult  . Consult to hospitalist  . Pulse oximetry, continuous  . EKG 12-Lead  . ED EKG  . Saline lock IV    Following Medications were ordered in ER: Medications  sodium chloride 0.9 % injection (has no administration in time range)  iopamidol (ISOVUE-370) 76 % injection (has no administration in time range)  acetaminophen (TYLENOL) tablet 1,000 mg (1,000 mg Oral Given  05/08/18 2130)  levofloxacin (LEVAQUIN) IVPB 750 mg (0 mg Intravenous Stopped 05/08/18 2336)  sodium chloride 0.9 % bolus 500 mL (0 mLs Intravenous Stopped 05/09/18 0002)  iopamidol (ISOVUE-370) 76 % injection 80 mL (80 mLs Intravenous Contrast Given 05/08/18 2312)    Significant initial  Findings: Abnormal Labs Reviewed  COMPREHENSIVE METABOLIC PANEL - Abnormal; Notable for the following components:      Result Value   Glucose, Bld 106 (*)    BUN 26 (*)    Creatinine, Ser 1.67 (*)    Alkaline Phosphatase 36 (*)    GFR calc non Af Amer 41 (*)    GFR calc Af Amer 47 (*)    All other components within normal limits  CBC WITH DIFFERENTIAL/PLATELET - Abnormal; Notable for the following components:   WBC 13.4 (*)    HCT 38.8 (*)    Neutro Abs 10.7 (*)    All other components within normal limits     Na 137 K 4.0  Cr    Up from baseline see below Lab Results  Component Value Date   CREATININE 1.67 (H) 05/08/2018   CREATININE 1.37 (H) 03/20/2014   CREATININE 1.19 03/06/2014    lipase 24  WBC 13.4  HG/HCT  stable,     Component Value Date/Time   HGB 13.1 05/08/2018 2150   HCT 38.8 (L) 05/08/2018 2150  Troponin (Point of Care Test) No results for input(s): TROPIPOC in the last 72 hours.    BNP (last 3 results) No results for input(s): BNP in the last 8760 hours.  ProBNP (last 3 results) No results for input(s): PROBNP in the last 8760 hours.  Lactic Acid, Venous    Component Value Date/Time   LATICACIDVEN 1.02 05/08/2018 2155      UA ordered  CTA - showed multiple bilateral PE  CTabd/pelvis -showing pneumonia infiltrate of the right lung base and left renal calculi non obstructive   ECG:  Personally reviewed by me showing: HR : 101 Rhythm  Sinus tachycardia    no evidence of ischemic changes QTC 433   ED Triage Vitals  Enc Vitals Group     BP 05/08/18 2000 134/87     Pulse Rate 05/08/18 2000 (!) 102     Resp 05/08/18 2000 20     Temp 05/08/18 2000  99.2 F (37.3 C)     Temp Source 05/08/18 2000 Oral     SpO2 05/08/18 2000 97 %     Weight 05/08/18 2359 230 lb (104.3 kg)     Height 05/08/18 2359 6\' 3"  (1.905 m)     Head Circumference --      Peak Flow --      Pain Score 05/08/18 2006 8     Pain Loc --      Pain Edu? --      Excl. in [place]? --   TMAX(24)@       Latest  Blood pressure 132/71, pulse 90, temperature 99.2 F (37.3 C), temperature source Oral, resp. rate 20, height 6\' 3"  (1.905 m), weight 104.3 kg, SpO2 95 %.   Hospitalist was called for admission for  Bilateral PE   Review of Systems:    Pertinent positives include:  shortness of breath at rest.  dyspnea on exertion flank pain.  Constitutional:  No weight loss, night sweats, Fevers, chills, weight loss  HEENT:  No headaches, Difficulty swallowing,Tooth/dental problems,Sore throat,  No sneezing, itching, ear ache, nasal congestion, post nasal drip,  Cardio-vascular:  No chest pain, Orthopnea, PND, anasarca, dizziness, palpitations.no Bilateral lower extremity swelling  GI:  No heartburn, indigestion, abdominal pain, nausea, vomiting, diarrhea, change in bowel habits, loss of appetite, melena, blood in stool, hematemesis Resp:   No excess mucus, no productive cough, No non-productive cough, No coughing up of blood.No change in color of mucus.No wheezing. Skin:  no rash or lesions. No jaundice GU:  no dysuria, change in color of urine, no urgency or frequency. No straining to urinate.  No  Musculoskeletal:  No joint pain or no joint swelling. No decreased range of motion. No back pain.  Psych:  No change in mood or affect. No depression or anxiety. No memory loss.  Neuro: no localizing neurological complaints, no tingling, no weakness, no double vision, no gait abnormality, no slurred speech, no confusion  All systems reviewed and apart from [place] all are negative  Past Medical History:   Past Medical History:  Diagnosis Date  . DVT of lower extremity  (deep venous thrombosis) (HCC)    left leg after knee surgery; DAILY ASA NOW  . Dysrhythmia    PVC'S  . Frequent PVCs   . GERD (gastroesophageal reflux disease)   . H/O hiatal hernia   . Hearing loss   . Hyperlipemia   . Hypertension   . Paroxysmal SVT (supraventricular tachycardia) ([place])   . Prostate cancer ([place]) 01/13/14  Gleason 4+4=8  . S/P radiation therapy 06/17/2014 through 08/04/2014                                                      Prostate bed 6600 cGy in 33 sessions                             Past Surgical History:  Procedure Laterality Date  . BACK SURGERY  2004   lumb lam  . COLONOSCOPY    . HERNIA REPAIR  2006   rt ing h  . KNEE SURGERY Left    arthroscopy  . LYMPHADENECTOMY Bilateral 03/19/2014   Procedure: LYMPHADENECTOMY;  Surgeon: Raynelle Bring, MD;  Location: [facility];  Service: Urology;  Laterality: Bilateral;  . PROSTATE BIOPSY  01/09/14   glaeson 4+4=8  . ROBOT ASSISTED LAPAROSCOPIC RADICAL PROSTATECTOMY N/A 03/19/2014   Procedure: ROBOTIC ASSISTED LAPAROSCOPIC RADICAL PROSTATECTOMY LEVEL 3;  Surgeon: Raynelle Bring, MD;  Location: [facility];  Service: Urology;  Laterality: N/A;    Social History:  Ambulatory   independently      reports that he has never smoked. He has never used smokeless tobacco. He reports that he drinks alcohol. He reports that he does not use drugs.     Family History:   Family History  Problem Relation Age of Onset  . Heart disease Father   . Heart attack Father   . Dementia Mother        mild  . Colon cancer Maternal Grandmother     Allergies: Allergies  Allergen Reactions  . Clindamycin/Lincomycin Diarrhea  . Other     The gel used for ultrasound  Aqua sonic 100 - caused rash  The ekg electrodes cause skin irritation that last for weeks Allergic to bees and wasps  . Penicillins Hives  . Polymyxin B Hives  . Venomil Mixed Vespid [Mixed Vespid Venom]   . Venomil Wasp Venom [Wasp Venom]      Prior to Admission  medications   Medication Sig Start Date End Date Taking? Authorizing Provider  acetaminophen (TYLENOL) 325 MG tablet Take 650 mg by mouth every 6 (six) hours as needed for moderate pain.   Yes [provider]  aspirin 325 MG EC tablet Take 325 mg by mouth daily.   Yes [provider]  esomeprazole (NEXIUM) 40 MG capsule Take 40 mg by mouth daily before breakfast.     Yes [provider]  fenofibrate (TRICOR) 145 MG tablet Take 145 mg by mouth every morning.    Yes [provider]  metoprolol succinate (TOPROL-XL) 50 MG 24 hr tablet Take 50 mg by mouth every morning. Take with or immediately following a meal.   Yes [provider]  olmesartan-hydrochlorothiazide (BENICAR HCT) 40-12.5 MG tablet Take 1 tablet by mouth daily. 03/29/18  Yes [provider]  omega-3 acid ethyl esters (LOVAZA) 1 G capsule Take 1 g by mouth daily.    Yes [provider]  simvastatin (ZOCOR) 40 MG tablet Take 40 mg by mouth every morning.    Yes [provider]  EPINEPHrine (EPIPEN 2-PAK) 0.3 mg/0.3 mL IJ SOAJ injection Inject 0.3 mLs (0.3 mg total) into the muscle once. 06/22/15   Jiles Prows, MD   Physical Exam: Blood pressure 132/71, pulse  90, temperature 99.2 F (37.3 C), temperature source Oral, resp. rate 20, height 6\' 3"  (1.905 m), weight 104.3 kg, SpO2 95 %. 1. General:  in No Acute distress  well  -appearing 2. Psychological: Alert and   Oriented 3. Head/ENT:    Dry Mucous Membranes                          Head Non traumatic, neck supple                          Normal  Dentition 4. SKIN:  decreased Skin turgor,  Skin clean Dry and intact no rash 5. Heart: Regular rate and rhythm no Murmur, no Rub or gallop 6. Lungs: Clear to auscultation bilaterally, no wheezes or crackles   7. Abdomen: Soft,  non-tender, Non distended  obese  bowel sounds present 8. Lower extremities: no clubbing, cyanosis, or  edema 9. Neurologically Grossly  intact, moving all 4 extremities equally  10. MSK: Normal range of motion   LABS:     Recent Labs  Lab 05/08/18 2150  WBC 13.4*  NEUTROABS 10.7*  HGB 13.1  HCT 38.8*  MCV 87.8  PLT 379   Basic Metabolic Panel: Recent Labs  Lab 05/08/18 2150  NA 137  K 4.0  CL 99  CO2 27  GLUCOSE 106*  BUN 26*  CREATININE 1.67*  CALCIUM 10.0      Recent Labs  Lab 05/08/18 2150  AST 37  ALT 35  ALKPHOS 36*  BILITOT 0.6  PROT 7.5  ALBUMIN 4.2   Recent Labs  Lab 05/08/18 2150  LIPASE 24   No results for input(s): AMMONIA in the last 168 hours.    HbA1C: No results for input(s): HGBA1C in the last 72 hours. CBG: No results for input(s): GLUCAP in the last 168 hours.    Urine analysis: No results found for: COLORURINE, APPEARANCEUR, LABSPEC, PHURINE, GLUCOSEU, HGBUR, BILIRUBINUR, KETONESUR, PROTEINUR, UROBILINOGEN, NITRITE, LEUKOCYTESUR     Cultures: No results found for: [place], [place], CULT, REPTSTATUS   Radiological Exams on Admission: Dg Chest 2 View  Result Date: 05/08/2018 CLINICAL DATA:  Short of breath EXAM: CHEST - 2 VIEW COMPARISON:  03/06/2014 FINDINGS: Postsurgical changes in the cervical spine. No focal opacity or pleural effusion. Normal heart size. No pneumothorax. Calcified granuloma/nodes in the left hilum. IMPRESSION: No active cardiopulmonary disease. Electronically Signed   By: Donavan Foil M.D.   On: 05/08/2018 20:33   Ct Angio Chest Pe W/cm &/or Wo Cm  Result Date: 05/08/2018 CLINICAL DATA:  Right-sided chest pain EXAM: CT ANGIOGRAPHY CHEST WITH CONTRAST TECHNIQUE: Multidetector CT imaging of the chest was performed using the standard protocol during bolus administration of intravenous contrast. Multiplanar CT image reconstructions and MIPs were obtained to evaluate the vascular anatomy. CONTRAST:  74mL ISOVUE-370 IOPAMIDOL (ISOVUE-370) INJECTION 76% COMPARISON:  Chest x-ray 05/08/2018 FINDINGS: Cardiovascular: Satisfactory opacification of  the pulmonary arteries to the segmental level. Multiple small filling defects within segmental and subsegmental branch vessels of the right middle lobe, subsegmental right lower lobe, and segmental and subsegmental branches of the left lower and upper lobe lobe pulmonary artery consistent with acute emboli. No significant pericardial effusion. Nonaneurysmal aorta. Mild aortic atherosclerosis. Borderline cardiomegaly. Mediastinum/Nodes: Calcified mediastinal and left hilar nodes. Midline trachea. Subcentimeter hypodense lesion left lobe of thyroid inferiorly. Esophagus within normal limits Lungs/Pleura: Ground-glass density in the posterior right lower lobe. No pneumothorax. Trace right pleural  effusion Upper Abdomen: Cyst in the upper pole of the left kidney. No acute abnormality. Musculoskeletal: No chest wall abnormality. No acute or significant osseous findings. Review of the MIP images confirms the above findings. IMPRESSION: 1. Positive for multiple small acute bilateral pulmonary emboli within segmental and subsegmental branches. 2. Ground-glass density in the posterior right lower lobe with adjacent small effusion, favored to represent pneumonia as opposed to infarct given lack of substantial emboli in this distribution. 3. Prior granulomatous disease Critical Value/emergent results were called by telephone at the time of interpretation on 05/08/2018 at 11:50 pm to Dr. Raul Del who took results for Alyssa , who verbally acknowledged these results. Aortic Atherosclerosis (ICD10-I70.0). Electronically Signed   By: Donavan Foil M.D.   On: 05/08/2018 23:51    Chart has been reviewed    Assessment/Plan   67 y.o. male with medical history significant of GERD, hypertension, HLD prostate cancer history of SVT, history of PE DVTs after knee surgery.    Admitted for Bilateral PE  Present on Admission: . Pulmonary embolism ([place]) -  Admit to    Telemetry  Initiate heparin drip  Would likely benefit from case  manager consult for long term anticoagulation Hold home blood pressure medications avoid hypotension Cycle cardiac enzymes Order echogram and lower extremity Dopplers  Most likely risk factors for hypercoagulable state being  malignancy      . Essential hypertension -allow permissive hypertension for tonight . Malignant neoplasm of prostate ([place]) -in remission follow-up with urology as needed . CAP (community acquired pneumonia) -pneumonia versus pulmonary necrosis in the setting of PE.  Will cover IV antibiotics await results of blood cultures sputum cultures check for strep pneumo   Other plan as per orders.  DVT prophylaxis:  heparin  Code Status:  FULL CODE  as per patient   I had personally discussed CODE STATUS with patient   Family Communication:   Family not  at  Bedside    Disposition Plan:      To home once workup is complete and patient is stable                                         Consults called: none    Admission status:    inpatient     Expect 2 midnight stay secondary to severity of patient's current illness including hemodynamic instability despite optimal treatment (tachycardia  )   Severe radiographic abnormalities including pulmonary embolism and extensive comorbidities including:      malignancy,   That are currently affecting medical management.  I expect  patient to be hospitalized for 2 midnights requiring inpatient medical care.  Patient is at high risk for adverse outcome (such as loss of life or disability) if not treated.  Indication for inpatient stay as follows:  Hemodynamic instability despite maximal medical therapy,      persistent chest/flank pain despite medical management   Need for IV antibiotics, IV fluids, IV rate controling medications, IV antihypertensives, IV medications      Level of care     tele  For 24H        Raeshaun Simson 05/09/2018, 1:48 AM    [facility]  Pager [phone]   after 2 AM please page  floor coverage PA If 7AM-7PM, please contact the day team taking care of the patient  [email or web address]  Password [email or web address]

## 2018-05-09 NOTE — Care Management Note (Signed)
Case Management Note  Patient Details  Name: [name] MRN: [id number] Date of Birth: 1951-06-10  Subjective/Objective: admitted wPE. Fromhome. CM referral for benefit check-xarelto/eliquis-await outcome.                   Action/Plan:dc home.   Expected Discharge Date:  05/11/18               Expected Discharge Plan:  Home/Self Care  In-House Referral:     Discharge planning Services  CM Consult  Post Acute Care Choice:    Choice offered to:     DME Arranged:    DME Agency:     HH Arranged:    HH Agency:     Status of Service:  In process, will continue to follow  If discussed at Long Length of Stay Meetings, dates discussed:    Additional Comments:  Dessa Phi, RN 05/09/2018, 12:14 PM

## 2018-05-09 NOTE — ED Provider Notes (Signed)
Physical Exam  BP 122/65 (BP Location: Right Arm)   Pulse 85   Temp 99 F (37.2 C) (Oral)   Resp 20   Ht 6\' 3"  (1.905 m)   Wt 104.8 kg   SpO2 95%   BMI 28.87 kg/m   Assumed care from [facility], PA-C at 2300. Briefly, the patient is a 67 y.o. male with PMHx of  has a past medical history of DVT of lower extremity (deep venous thrombosis) ([place]), Dysrhythmia, Frequent PVCs, GERD (gastroesophageal reflux disease), H/O hiatal hernia, Hearing loss, Hyperlipemia, Hypertension, Paroxysmal SVT (supraventricular tachycardia) ([place]), Prostate cancer ([place]) (01/13/14), and S/P radiation therapy (06/17/2014 through 08/04/2014                                                   ). here with right flank pain, shortness of breath, and RLL PNA diagnosed on CT renal stone study performed per urology today.  Patient presenting for progressively worsening right flank pain, shortness of breath with exertion.  Patient reports history of DVT, but is no longer anticoagulated, and takes 325 mg of aspirin daily. No history of GI or GU bleeding while anticoagulated on Xarelto.   Labs Reviewed  COMPREHENSIVE METABOLIC PANEL - Abnormal; Notable for the following components:      Result Value   Glucose, Bld 106 (*)    BUN 26 (*)    Creatinine, Ser 1.67 (*)    Alkaline Phosphatase 36 (*)    GFR calc non Af Amer 41 (*)    GFR calc Af Amer 47 (*)    All other components within normal limits  CBC WITH DIFFERENTIAL/PLATELET - Abnormal; Notable for the following components:   WBC 13.4 (*)    HCT 38.8 (*)    Neutro Abs 10.7 (*)    All other components within normal limits  URINALYSIS, ROUTINE W REFLEX MICROSCOPIC - Abnormal; Notable for the following components:   Hgb urine dipstick MODERATE (*)    Bacteria, UA RARE (*)    All other components within normal limits  CULTURE, BLOOD (ROUTINE X 2)  CULTURE, BLOOD (ROUTINE X 2)  EXPECTORATED SPUTUM ASSESSMENT W REFEX TO RESP CULTURE  GRAM STAIN  LIPASE, BLOOD  APTT   PROTIME-INR  TROPONIN I  STREP PNEUMONIAE URINARY ANTIGEN  TROPONIN I  TROPONIN I  BRAIN NATRIURETIC PEPTIDE  HEPARIN LEVEL (UNFRACTIONATED)  CBC  MAGNESIUM  PHOSPHORUS  TSH  COMPREHENSIVE METABOLIC PANEL  CBC  HIV ANTIBODY (ROUTINE TESTING W REFLEX)  I-STAT CG4 LACTIC ACID, ED    Course of Care:   Physical Exam  Constitutional: He appears well-developed and well-nourished. No distress.  Sitting comfortably in bed.  HENT:  Head: Normocephalic and atraumatic.  Eyes: Conjunctivae are normal. Right eye exhibits no discharge. Left eye exhibits no discharge.  EOMs normal to gross examination.  Neck: Normal range of motion.  Cardiovascular: Normal rate and regular rhythm.  Pulmonary/Chest: Effort normal. No accessory muscle usage. No respiratory distress. He has rales in the right lower field and the left lower field.  Abdominal: He exhibits no distension.  Musculoskeletal: Normal range of motion.  Neurological: He is alert.  Cranial nerves intact to gross observation. Patient moves extremities without difficulty.  Skin: Skin is warm and dry. He is not diaphoretic.  Psychiatric: He has a normal mood and affect. His behavior is normal.  Judgment and thought content normal.  Nursing note and vitals reviewed.   ED Course/Procedures   Clinical Course as of May 09 628  Wed May 08, 2018  2100 Spoke with Radiologist Dr. Kris Hartmann who read patient's CT abdomen/pelvis earlier   Pneumonia- infiltrate on right lung base, left sided calculi   [EH]  4427 67 year old male here with some right upper quadrant that turned into right flank pain over the last few days.  He saw his urologist and had a noncontrast scan that did not show an obvious kidney stone but he was then called later saying that there is a possible right lower lobe pneumonia.  He felt a little short of breath but there has not been any cough or fever until today.  He does have a history of blood clots and was on  anticoagulation but not anymore.  We are checking some screening labs and likely will proceed with a CT PE if his creatinine was tolerated.  If he does not have a CT I think he could be sent home on oral antibiotics as long as he is trending well off of oxygen.   [MB]  2357 Patient has multiple acute bilateral pulmonary emboli.  Will start heparin.  Will seek admission.  CT Angio Chest PE W/Cm &/Or Wo Cm [AM]  Thu May 09, 2018  0007 Evaluated at bedside.  Patient not tachypneic, hypoxic or tachycardic.  Resting comfortably.  Delivered results.   [AM]    Clinical Course User Index [AM] Albesa Seen, PA-C [EH] Lorin Glass, PA-C [MB] Hayden Rasmussen, MD    Procedures  MDM    On my examination, patient is nontoxic-appearing, afebrile, and hemodynamically stable.  Patient actively receiving Levaquin.  No tachycardia or tachypnea. Discussed results of CTPA with patient and indications for admission.  Patient started on heparin for PE. No history of bleeding on anticoagulation.  Patient admitted to hospital medicine per Dr. Roel Cluck.  I appreciate her involvement in the care of this patient.    Albesa Seen, PA-C 05/09/18 [id number]    Hayden Rasmussen, MD 05/09/18 [phone]

## 2018-05-09 NOTE — Progress Notes (Signed)
Benefit check:xarelto/eliqus both$45-no prior auth needed;preferred pharmacy:[place]/[facility].

## 2018-05-09 NOTE — Progress Notes (Signed)
  Echocardiogram 2D Echocardiogram has been performed.  [name] 05/09/2018, 4:12 PM

## 2018-05-09 NOTE — Progress Notes (Signed)
[place] for IV heparin Indication: pulmonary embolus  Allergies  Allergen Reactions  . Clindamycin/Lincomycin Diarrhea  . Other     The gel used for ultrasound  Aqua sonic 100 - caused rash  The ekg electrodes cause skin irritation that last for weeks Allergic to bees and wasps  . Penicillins Hives  . Polymyxin B Hives  . Venomil Mixed Vespid [Mixed Vespid Venom]   . Venomil Wasp Venom [Wasp Venom]     Patient Measurements: Height: 6\' 3"  (190.5 cm) Weight: 231 lb (104.8 kg) IBW/kg (Calculated) : 84.5 Heparin Dosing Weight: 91 kg  Vital Signs: Temp: 99 F (37.2 C) (10/03 0448) Temp Source: Oral (10/03 0448) BP: 122/65 (10/03 0448) Pulse Rate: 85 (10/03 0448)  Labs: Recent Labs    05/08/18 2150 05/09/18 0033 05/09/18 0710 05/09/18 0715  HGB 13.1  --  12.6*  --   HCT 38.8*  --  38.0*  --   PLT 251  --  239  --   APTT  --  28  --   --   LABPROT  --  13.6  --   --   INR  --  1.05  --   --   HEPARINUNFRC  --   --   --  0.37  CREATININE 1.67*  --  1.54*  --   TROPONINI  --  <0.03  --   --     Estimated Creatinine Clearance: 61 mL/min (A) (by C-G formula based on SCr of 1.54 mg/dL (H)).   Assessment: 67 yoM c/o right sided flank pain found to have multiple small bilateral PEs. Pharmacy asked to dose IV heparin for PE.  Baseline labs: aptt= 28, INR=1.05 and CBC WNL 05/09/2018 First heparin level therapeutic at 0.37 after 5000 unit bolus and drip at 1500 units/hr.  Hg 12.6, PTLC WNL, no bleeding reported Per MD note plan is 24 hrs UFH then transition to [place] if remains stable.  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   Goal of Therapy:  Heparin level 0.3-0.7 units/ml Monitor platelets by anticoagulation protocol: Yes   Plan:  Increase heparin drip to 1600 units/hr and check 8 hr HL Daily CBC/HL F/u transition to [place] - was on Xarelto in the past  Eudelia Bunch,  Pharm.D [phone] 05/09/2018 9:51 AM

## 2018-05-09 NOTE — Progress Notes (Signed)
PT Cancellation Note  Patient Details Name: [name] MRN: [id number] DOB: 21-Sep-1950   Cancelled Treatment:    Reason Eval/Treat Not Completed: Patient at procedure or test/unavailable, per RN, MD ok'd for patient to be OOB to recliner. Will check back tomorrow.   Oletha Tolson [place] Pager [phone] Office [phone]  05/09/2018, 3:44 PM

## 2018-05-10 LAB — CBC
HEMATOCRIT: 37.3 % — AB (ref 39.0–52.0)
HEMOGLOBIN: 12.1 g/dL — AB (ref 13.0–17.0)
MCH: 29.2 pg (ref 26.0–34.0)
MCHC: 32.4 g/dL (ref 30.0–36.0)
MCV: 89.9 fL (ref 78.0–100.0)
Platelets: 192 10*3/uL (ref 150–400)
RBC: 4.15 MIL/uL — ABNORMAL LOW (ref 4.22–5.81)
RDW: 13.2 % (ref 11.5–15.5)
WBC: 10.2 10*3/uL (ref 4.0–10.5)

## 2018-05-10 LAB — BASIC METABOLIC PANEL
Anion gap: 11 (ref 5–15)
BUN: 22 mg/dL (ref 8–23)
CO2: 24 mmol/L (ref 22–32)
Calcium: 9 mg/dL (ref 8.9–10.3)
Chloride: 101 mmol/L (ref 98–111)
Creatinine, Ser: 1.39 mg/dL — ABNORMAL HIGH (ref 0.61–1.24)
GFR calc Af Amer: 59 mL/min — ABNORMAL LOW (ref >60)
GFR calc non Af Amer: 51 mL/min — ABNORMAL LOW (ref >60)
Glucose, Bld: 97 mg/dL (ref 70–99)
Potassium: 3.8 mmol/L (ref 3.5–5.1)
Sodium: 136 mmol/L (ref 135–145)

## 2018-05-10 LAB — HEPARIN LEVEL (UNFRACTIONATED): Heparin Unfractionated: 0.24 IU/mL — ABNORMAL LOW (ref 0.30–0.70)

## 2018-05-10 MED ORDER — ACETAMINOPHEN 500 MG PO TABS: 1000.0000 mg | ORAL_TABLET | Freq: Four times a day (QID) | ORAL | Status: DC | PRN

## 2018-05-10 MED ORDER — RIVAROXABAN (XARELTO) VTE STARTER PACK (15 & 20 MG): ORAL_TABLET | ORAL | 0 refills | Status: DC

## 2018-05-10 MED ORDER — RIVAROXABAN 15 MG PO TABS
15.0000 mg | ORAL_TABLET | Freq: Two times a day (BID) | ORAL | Status: DC
  Administered 2018-05-10: 15 mg via ORAL
  Filled 2018-05-10: qty 1

## 2018-05-10 MED ORDER — RIVAROXABAN (XARELTO) EDUCATION KIT FOR DVT/PE PATIENTS
PACK | Freq: Once | Status: AC
  Administered 2018-05-10: 11:00:00
  Filled 2018-05-10: qty 1

## 2018-05-10 MED ORDER — HEPARIN BOLUS VIA INFUSION
2000.0000 [IU] | Freq: Once | INTRAVENOUS | Status: AC
  Administered 2018-05-10: 2000 [IU] via INTRAVENOUS
  Filled 2018-05-10: qty 2000

## 2018-05-10 MED ORDER — HEPARIN (PORCINE) IN NACL 100-0.45 UNIT/ML-% IJ SOLN
2100.0000 [IU]/h | INTRAMUSCULAR | Status: DC
  Administered 2018-05-10: 2100 [IU]/h via INTRAVENOUS

## 2018-05-10 NOTE — Discharge Summary (Signed)
Physician Discharge Summary  SWADE SHONKA  EXH:[id number]  DOB: 04/30/1951  DOA: 05/08/2018 PCP: Prince Solian, MD  Admit date: 05/08/2018 Discharge date: 05/10/2018  Admitted From: home  Disposition:  Home   Recommendations for Outpatient Follow-up:  1. Follow up with PCP in 1 week  2. May need hypercoagulable workup recommend to send studies in 4-6 weeks  3. Please obtain BMP/CBC in one week to monitor renal function and hemoglobin  Discharge Condition: Stable CODE STATUS: Home Diet recommendation: Heart Healthy   Brief/Interim Summary: For full details see H&P/Progress note, but in brief, [name] is a 67 year old male with medical history significant for hypertension, prostate cancer on remission, history of SVT and prior DVT post surgical procedure.  Patient presented to the emergency department complaining of back pain and shortness of breath.  Upon initial evaluation CTA of the chest showed multiple bilateral PE and possible right lower lobe pneumonia.  She was admitted with working diagnosis of pulmonary embolism and started on heparin drip.  Echocardiogram was performed which showed EF of 60 to 65% no signs of right heart strain.  Lower extremity Doppler ultrasound shows acute right DVT and chronic left DVT.  Patient was treated with heparin drip for over 24 hours, clinically remained stable and was transitioned to Xarelto.  Patient was also found to have AKI upon admission which improved with IV hydration.  Patient's vital signs remained stable with no needing of oxygen.  Initially was treated with antibiotic for possible right lower lobe pneumonia however procalcitonin was negative and antibiotics were discontinued.  Patient clinically improved and deemed stable for discharge to follow-up as an outpatient.  Subjective: Patient seen and examined continues to feel better.  Denies shortness of breath, chest pain and palpitations.  Working with incentive spirometry.  No  acute events overnight.  Remains afebrile.  Discharge Diagnoses/Hospital Course:  Acute pulmonary embolism/acute right LE DVT, chronic left LE DVT Unclear etiology at this time, ?  Hypercoagulable state from chronic DVT versus malignancy.  Patient has been in remission for 4 years.  May need to check for recurrence of malignancy as an outpatient.  Will need indefinitely anticoagulation. Patient stable with no oxygen requirement during hospital stay.  Clinically improved.  He will be discharged on Xarelto 15 mg twice daily for 21 days, then 20 mg a day indefinitely. D/c ASA. Follow-up with PCP for further work-up if needed.  AKI on CKD stage III Likely due to dehydration, creatinine improved after IV fluids.  Encourage oral hydration.  Avoid nephrotoxic agents.  Check BMP in 1 week with PCP  Pneumonia ruled out CT scan with changes representing possible pneumonia, however in setting of PE this most likely represent pulmonary infarction.  Patient was started on antibiotic therapy however procalcitonin was negative therefore Levaquin was discontinued.  No need for further antibiotic therapy at this time.  Essential hypertension Blood pressure stable during hospital stay, initially blood pressure medications were held, can resume home regimen with no changes at this time.  All other chronic medical condition were stable during the hospitalization.  On the day of the discharge the patient's vitals were stable, and no other acute medical condition were reported by patient. the patient was felt safe to be discharge to home.   Discharge Instructions  You were cared for by a hospitalist during your hospital stay. If you have any questions about your discharge medications or the care you received while you were in the hospital after you are discharged, you  can call the unit and asked to speak with the hospitalist on call if the hospitalist that took care of you is not available. Once you are discharged,  your primary care physician will handle any further medical issues. Please note that NO REFILLS for any discharge medications will be authorized once you are discharged, as it is imperative that you return to your primary care physician (or establish a relationship with a primary care physician if you do not have one) for your aftercare needs so that they can reassess your need for medications and monitor your lab values.  Discharge Instructions    Call MD for:  difficulty breathing, headache or visual disturbances   Complete by:  As directed    Call MD for:  extreme fatigue   Complete by:  As directed    Call MD for:  hives   Complete by:  As directed    Call MD for:  persistant dizziness or light-headedness   Complete by:  As directed    Call MD for:  persistant nausea and vomiting   Complete by:  As directed    Call MD for:  redness, tenderness, or signs of infection (pain, swelling, redness, odor or green/yellow discharge around incision site)   Complete by:  As directed    Call MD for:  severe uncontrolled pain   Complete by:  As directed    Call MD for:  temperature >100.4   Complete by:  As directed    Diet - low sodium heart healthy   Complete by:  As directed    Increase activity slowly   Complete by:  As directed      Allergies as of 05/10/2018      Reactions   Clindamycin/lincomycin Diarrhea   Other    The gel used for ultrasound  Aqua sonic 100 - caused rash The ekg electrodes cause skin irritation that last for weeks Allergic to bees and wasps   Penicillins Hives   Polymyxin B Hives   Venomil Mixed Vespid [mixed Vespid Venom]    Venomil Wasp Venom [wasp Venom]       Medication List    STOP taking these medications   aspirin 325 MG EC tablet     TAKE these medications   acetaminophen 325 MG tablet Commonly known as:  TYLENOL Take 650 mg by mouth every 6 (six) hours as needed for moderate pain.   EPINEPHrine 0.3 mg/0.3 mL Soaj injection Commonly known as:   EPI-PEN Inject 0.3 mLs (0.3 mg total) into the muscle once.   esomeprazole 40 MG capsule Commonly known as:  NEXIUM Take 40 mg by mouth daily before breakfast.   fenofibrate 145 MG tablet Commonly known as:  TRICOR Take 145 mg by mouth every morning.   metoprolol succinate 50 MG 24 hr tablet Commonly known as:  TOPROL-XL Take 50 mg by mouth every morning. Take with or immediately following a meal.   olmesartan-hydrochlorothiazide 40-12.5 MG tablet Commonly known as:  BENICAR HCT Take 1 tablet by mouth daily.   omega-3 acid ethyl esters 1 g capsule Commonly known as:  LOVAZA Take 1 g by mouth daily.   Rivaroxaban 15 & 20 MG Tbpk Take as directed: Start with one 15mg  tablet by mouth twice a day with food. On Day 22, switch to one 20mg  tablet once a day with food.   simvastatin 40 MG tablet Commonly known as:  ZOCOR Take 40 mg by mouth every morning.      Follow-up  Information    Avva, Ravisankar, MD. Schedule an appointment as soon as possible for a visit in 1 week(s).   Specialty:  Internal Medicine Why:  Hospital follow-up Contact information: [address] [place] [address] [phone]          Allergies  Allergen Reactions  . Clindamycin/Lincomycin Diarrhea  . Other     The gel used for ultrasound  Aqua sonic 100 - caused rash  The ekg electrodes cause skin irritation that last for weeks Allergic to bees and wasps  . Penicillins Hives  . Polymyxin B Hives  . Venomil Mixed Vespid [Mixed Vespid Venom]   . Venomil Wasp Venom [Wasp Venom]     Consultations:   Procedures/Studies: Dg Chest 2 View  Result Date: 05/08/2018 CLINICAL DATA:  Short of breath EXAM: CHEST - 2 VIEW COMPARISON:  03/06/2014 FINDINGS: Postsurgical changes in the cervical spine. No focal opacity or pleural effusion. Normal heart size. No pneumothorax. Calcified granuloma/nodes in the left hilum. IMPRESSION: No active cardiopulmonary disease. Electronically Signed   By: Donavan Foil M.D.   On: 05/08/2018 20:33   Ct Angio Chest Pe W/cm &/or Wo Cm  Result Date: 05/08/2018 CLINICAL DATA:  Right-sided chest pain EXAM: CT ANGIOGRAPHY CHEST WITH CONTRAST TECHNIQUE: Multidetector CT imaging of the chest was performed using the standard protocol during bolus administration of intravenous contrast. Multiplanar CT image reconstructions and MIPs were obtained to evaluate the vascular anatomy. CONTRAST:  66mL ISOVUE-370 IOPAMIDOL (ISOVUE-370) INJECTION 76% COMPARISON:  Chest x-ray 05/08/2018 FINDINGS: Cardiovascular: Satisfactory opacification of the pulmonary arteries to the segmental level. Multiple small filling defects within segmental and subsegmental branch vessels of the right middle lobe, subsegmental right lower lobe, and segmental and subsegmental branches of the left lower and upper lobe lobe pulmonary artery consistent with acute emboli. No significant pericardial effusion. Nonaneurysmal aorta. Mild aortic atherosclerosis. Borderline cardiomegaly. Mediastinum/Nodes: Calcified mediastinal and left hilar nodes. Midline trachea. Subcentimeter hypodense lesion left lobe of thyroid inferiorly. Esophagus within normal limits Lungs/Pleura: Ground-glass density in the posterior right lower lobe. No pneumothorax. Trace right pleural effusion Upper Abdomen: Cyst in the upper pole of the left kidney. No acute abnormality. Musculoskeletal: No chest wall abnormality. No acute or significant osseous findings. Review of the MIP images confirms the above findings. IMPRESSION: 1. Positive for multiple small acute bilateral pulmonary emboli within segmental and subsegmental branches. 2. Ground-glass density in the posterior right lower lobe with adjacent small effusion, favored to represent pneumonia as opposed to infarct given lack of substantial emboli in this distribution. 3. Prior granulomatous disease Critical Value/emergent results were called by telephone at the time of interpretation on  05/08/2018 at 11:50 pm to Dr. Raul Del who took results for Alyssa , who verbally acknowledged these results. Aortic Atherosclerosis (ICD10-I70.0). Electronically Signed   By: Donavan Foil M.D.   On: 05/08/2018 23:51   ECHO  10/3 ------------------------------------------------------------------- Study Conclusions  - Left ventricle: The cavity size was normal. Wall thickness was   increased in a pattern of mild LVH. Systolic function was normal.   The estimated ejection fraction was in the range of 60% to 65%. - Right ventricle: Systolic function was mildly reduced.  Impressions:  - Poor acoustic windows limit study.  Discharge Exam: Vitals:   05/10/18 0010 05/10/18 0458  BP: 130/75 119/71  Pulse: 90 85  Resp: 20 20  Temp: 99.6 F (37.6 C) 99 F (37.2 C)  SpO2: 96% 96%   Vitals:   05/09/18 1204 05/09/18 2001 05/10/18 0010  05/10/18 0458  BP: 129/77 131/77 130/75 119/71  Pulse: 95 92 90 85  Resp: 16 20 20 20   Temp: 98.6 F (37 C) 99.2 F (37.3 C) 99.6 F (37.6 C) 99 F (37.2 C)  TempSrc: Oral Oral Oral Oral  SpO2: 95% 96% 96% 96%  Weight:      Height:        General: Pt is alert, awake, not in acute distress Cardiovascular: RRR, S1/S2 +, no rubs, no gallops Respiratory: CTA bilaterally, no wheezing, no rhonchi Abdominal: Soft, NT, ND, bowel sounds + Extremities: no edema, no cyanosis   The results of significant diagnostics from this hospitalization (including imaging, microbiology, ancillary and laboratory) are listed below for reference.     Microbiology: Recent Results (from the past 240 hour(s))  Culture, blood (routine x 2)     Status: None (Preliminary result)   Collection Time: 05/08/18  9:52 PM  Result Value Ref Range Status   Specimen Description   Final    BLOOD LEFT ARM Performed at [facility] [address] [place], [place] [address]    Special Requests   Final    BOTTLES DRAWN AEROBIC AND ANAEROBIC Blood Culture results  may not be optimal due to an excessive volume of blood received in culture bottles Performed at [facility] [address], [place], [place] [address]    Culture   Final    NO GROWTH 2 DAYS Performed at [facility] [address], [place], [place] [address]    Report Status PENDING  Incomplete  Culture, blood (routine x 2)     Status: None (Preliminary result)   Collection Time: 05/08/18  9:59 PM  Result Value Ref Range Status   Specimen Description   Final    BLOOD RIGHT HAND Performed at [facility] [address] [place], [place] [address]    Special Requests   Final    BOTTLES DRAWN AEROBIC AND ANAEROBIC Blood Culture adequate volume Performed at [facility] [address], [place], [place] [address]    Culture   Final    NO GROWTH 2 DAYS Performed at [facility] [address], [place], [place] [address]    Report Status PENDING  Incomplete     Labs: BNP (last 3 results) No results for input(s): BNP in the last 8760 hours. Basic Metabolic Panel: Recent Labs  Lab 05/08/18 2150 05/09/18 0710 05/10/18 0533  NA 137 137 136  K 4.0 3.9 3.8  CL 99 104 101  CO2 27 24 24   GLUCOSE 106* 103* 97  BUN 26* 24* 22  CREATININE 1.67* 1.54* 1.39*  CALCIUM 10.0 9.5 9.0  MG  --  1.9  --    Liver Function Tests: Recent Labs  Lab 05/08/18 2150 05/09/18 0710  AST 37 47*  ALT 35 43  ALKPHOS 36* 37*  BILITOT 0.6 1.0  PROT 7.5 7.4  ALBUMIN 4.2 4.1   Recent Labs  Lab 05/08/18 2150  LIPASE 24   No results for input(s): AMMONIA in the last 168 hours. CBC: Recent Labs  Lab 05/08/18 2150 05/09/18 0710 05/10/18 0533  WBC 13.4* 11.7* 10.2  NEUTROABS 10.7*  --   --   HGB 13.1 12.6* 12.1*  HCT 38.8* 38.0* 37.3*  MCV 87.8 88.4 89.9  PLT 251 239 192   Cardiac Enzymes: Recent Labs  Lab 05/09/18 0033  TROPONINI <0.03   BNP: Invalid input(s): POCBNP CBG: No  results for input(s): GLUCAP in  the last 168 hours. D-Dimer No results for input(s): DDIMER in the last 72 hours. Hgb A1c No results for input(s): HGBA1C in the last 72 hours. Lipid Profile No results for input(s): CHOL, HDL, LDLCALC, TRIG, CHOLHDL, LDLDIRECT in the last 72 hours. Thyroid function studies No results for input(s): TSH, T4TOTAL, T3FREE, THYROIDAB in the last 72 hours.  Invalid input(s): FREET3 Anemia work up No results for input(s): VITAMINB12, FOLATE, FERRITIN, TIBC, IRON, RETICCTPCT in the last 72 hours. Urinalysis    Component Value Date/Time   COLORURINE YELLOW 05/09/2018 0033   APPEARANCEUR CLEAR 05/09/2018 0033   LABSPEC 1.030 05/09/2018 0033   PHURINE 6.0 05/09/2018 0033   GLUCOSEU NEGATIVE 05/09/2018 0033   HGBUR MODERATE (A) 05/09/2018 0033   BILIRUBINUR NEGATIVE 05/09/2018 0033   KETONESUR NEGATIVE 05/09/2018 0033   PROTEINUR NEGATIVE 05/09/2018 0033   NITRITE NEGATIVE 05/09/2018 0033   LEUKOCYTESUR NEGATIVE 05/09/2018 0033   Sepsis Labs Invalid input(s): PROCALCITONIN,  WBC,  LACTICIDVEN Microbiology Recent Results (from the past 240 hour(s))  Culture, blood (routine x 2)     Status: None (Preliminary result)   Collection Time: 05/08/18  9:52 PM  Result Value Ref Range Status   Specimen Description   Final    BLOOD LEFT ARM Performed at [facility], [place] [address], [place], [place] [address]    Special Requests   Final    BOTTLES DRAWN AEROBIC AND ANAEROBIC Blood Culture results may not be optimal due to an excessive volume of blood received in culture bottles Performed at [facility] [address], [place], [place] [address]    Culture   Final    NO GROWTH 2 DAYS Performed at [facility] [address] [place], [place] [address]    Report Status PENDING  Incomplete  Culture, blood (routine x 2)     Status: None (Preliminary result)   Collection Time: 05/08/18  9:59 PM  Result Value Ref Range Status   Specimen Description    Final    BLOOD RIGHT HAND Performed at [facility] [address], [place], [place] [address]    Special Requests   Final    BOTTLES DRAWN AEROBIC AND ANAEROBIC Blood Culture adequate volume Performed at [facility] [address], [place], [place] [address]    Culture   Final    NO GROWTH 2 DAYS Performed at [facility] [address], [place],  [address]    Report Status PENDING  Incomplete    Time coordinating discharge: 35 minutes  SIGNED:  Chipper Oman, MD  [facility] 05/10/2018, 11:47 AM  Pager please text page via  [email or web address]  Note - This record has been created using [employer]. Chart creation errors have been sought, but may not always have been located. Such creation errors do not reflect on the standard of medical care.

## 2018-05-10 NOTE — Discharge Instructions (Signed)
Information on my medicine - XARELTO (rivaroxaban)  This medication education was reviewed with me or my healthcare representative as part of my discharge preparation.  The pharmacist that spoke with me during my hospital stay was:  Eudelia Bunch, [place]? Xarelto was prescribed to treat blood clots that may have been found in the veins of your legs (deep vein thrombosis) or in your lungs (pulmonary embolism) and to reduce the risk of them occurring again.  What do you need to know about Xarelto? The starting dose is one 15 mg tablet taken TWICE daily with food for the FIRST 21 DAYS after all 15 mg tablets have been taken  the dose is changed to one 20 mg tablet taken ONCE A DAY with your evening meal.  DO NOT stop taking Xarelto without talking to the health care provider who prescribed the medication.  Refill your prescription for 20 mg tablets before you run out.  After discharge, you should have regular check-up appointments with your healthcare provider that is prescribing your Xarelto.  In the future your dose may need to be changed if your kidney function changes by a significant amount.  What do you do if you miss a dose? If you are taking Xarelto TWICE DAILY and you miss a dose, take it as soon as you remember. You may take two 15 mg tablets (total 30 mg) at the same time then resume your regularly scheduled 15 mg twice daily the next day.  If you are taking Xarelto ONCE DAILY and you miss a dose, take it as soon as you remember on the same day then continue your regularly scheduled once daily regimen the next day. Do not take two doses of Xarelto at the same time.   Important Safety Information Xarelto is a blood thinner medicine that can cause bleeding. You should call your healthcare provider right away if you experience any of the following: ? Bleeding from an injury or your nose that does not stop. ? Unusual colored urine (red or dark  brown) or unusual colored stools (red or black). ? Unusual bruising for unknown reasons. ? A serious fall or if you hit your head (even if there is no bleeding).  Some medicines may interact with Xarelto and might increase your risk of bleeding while on Xarelto. To help avoid this, consult your healthcare provider or pharmacist prior to using any new prescription or non-prescription medications, including herbals, vitamins, non-steroidal anti-inflammatory drugs (NSAIDs) and supplements.  This website has more information on Xarelto: [email or web address]

## 2018-05-10 NOTE — Progress Notes (Signed)
PT Cancellation Note  Patient Details Name: LEVERETT CAMPLIN MRN: [id number] DOB: 01-03-1951   Cancelled Treatment:    Reason Eval/Treat Not Completed: PT screened, no needs identified, will sign off DC to home   Claretha Cooper 05/10/2018, 12:16 PM  Tresa Endo PT Acute Rehabilitation Services Pager [phone] Office [phone]

## 2018-05-10 NOTE — Progress Notes (Signed)
Went over discharge papers with patient and family. All questions answered.  Explained importance of taking medications as prescribed.  AVS and prescription given to patient.

## 2018-05-10 NOTE — Progress Notes (Addendum)
[facility] for IV heparin Indication: pulmonary embolus  Allergies  Allergen Reactions  . Clindamycin/Lincomycin Diarrhea  . Other     The gel used for ultrasound  Aqua sonic 100 - caused rash  The ekg electrodes cause skin irritation that last for weeks Allergic to bees and wasps  . Penicillins Hives  . Polymyxin B Hives  . Venomil Mixed Vespid [Mixed Vespid Venom]   . Venomil Wasp Venom [Wasp Venom]    Patient Measurements: Height: 6\' 3"  (190.5 cm) Weight: 231 lb (104.8 kg) IBW/kg (Calculated) : 84.5 Heparin Dosing Weight: 91 kg  Vital Signs: Temp: 99 F (37.2 C) (10/04 0458) Temp Source: Oral (10/04 0458) BP: 119/71 (10/04 0458) Pulse Rate: 85 (10/04 0458)  Labs: Recent Labs    05/08/18 2150 05/09/18 0033 05/09/18 0710 05/09/18 0715 05/09/18 2020 05/10/18 0533  HGB 13.1  --  12.6*  --   --  12.1*  HCT 38.8*  --  38.0*  --   --  37.3*  PLT 251  --  239  --   --  192  APTT  --  28  --   --   --   --   LABPROT  --  13.6  --   --   --   --   INR  --  1.05  --   --   --   --   HEPARINUNFRC  --   --   --  0.37 0.29* 0.24*  CREATININE 1.67*  --  1.54*  --   --  1.39*  TROPONINI  --  <0.03  --   --   --   --    Estimated Creatinine Clearance: 67.5 mL/min (A) (by C-G formula based on SCr of 1.39 mg/dL (H)).  Assessment: 23 yoM c/o right sided flank pain found to have multiple small bilateral PEs. Pharmacy asked to dose IV heparin for PE.  Baseline labs: aptt= 28, INR=1.05 and CBC WNL  Per MD note plan is 24 hrs UFH then transition to [facility] if remains stable.  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    10/3  First heparin level therapeutic at 0.37 after 5000 unit bolus and drip at 1500 units/hr.  Hg 12.6, PTLC WNL, no bleeding reported Increase heparin drip to 1600 units/hr  Second Heparin level decreased at 0.29 units/ml, slightly subtherapeutic, despite empiric rate increase, and large  bolus  Today, 10/4  0533 HL= 0.24 below goal, no infusion or bleeding issues per RN.   Goal of Therapy:  Heparin level 0.3-0.7 units/ml Monitor platelets by anticoagulation protocol: Yes   Plan:  Give 2000 unit IV Heparin bolus x1 now Increase Heparin to 2100 units/hr Recheck Heparin level in 6 hr Daily CBC No Daily Hep level yet F/u transition to [place] - was on Xarelto in the past   Dorrene German 05/10/2018, 6:46 AM

## 2018-05-10 NOTE — Progress Notes (Addendum)
[facility] for IV heparin>>xarelto Indication: pulmonary embolus  Allergies  Allergen Reactions  . Clindamycin/Lincomycin Diarrhea  . Other     The gel used for ultrasound  Aqua sonic 100 - caused rash  The ekg electrodes cause skin irritation that last for weeks Allergic to bees and wasps  . Penicillins Hives  . Polymyxin B Hives  . Venomil Mixed Vespid [Mixed Vespid Venom]   . Venomil Wasp Venom [Wasp Venom]    Patient Measurements: Height: 6\' 3"  (190.5 cm) Weight: 231 lb (104.8 kg) IBW/kg (Calculated) : 84.5 Heparin Dosing Weight: 91 kg  Vital Signs: Temp: 99 F (37.2 C) (10/04 0458) Temp Source: Oral (10/04 0458) BP: 119/71 (10/04 0458) Pulse Rate: 85 (10/04 0458)  Labs: Recent Labs    05/08/18 2150 05/09/18 0033 05/09/18 0710 05/09/18 0715 05/09/18 2020 05/10/18 0533  HGB 13.1  --  12.6*  --   --  12.1*  HCT 38.8*  --  38.0*  --   --  37.3*  PLT 251  --  239  --   --  192  APTT  --  28  --   --   --   --   LABPROT  --  13.6  --   --   --   --   INR  --  1.05  --   --   --   --   HEPARINUNFRC  --   --   --  0.37 0.29* 0.24*  CREATININE 1.67*  --  1.54*  --   --  1.39*  TROPONINI  --  <0.03  --   --   --   --    Estimated Creatinine Clearance: 67.5 mL/min (A) (by C-G formula based on SCr of 1.39 mg/dL (H)).  Assessment: 42 yoM c/o right sided flank pain found to have multiple small bilateral PEs. Pharmacy asked to dose IV heparin for PE.  Baseline labs: aptt= 28, INR=1.05 and CBC WNL  05/10/2018 Transition to xarelto SCr 1.39, Hg 12.1, PLTC 192  Plan: Dc heparin xarelto 15 mg po bid with meals x 21 days then xarelto 20 qsupper Will provide 30 day free card ( but he will not qualify if he has used it in the past) and xarelto education Please prescribe Xarelto VTE starter pack for pt  Eudelia Bunch, Pharm.D [phone] 05/10/2018 9:03 AM

## 2018-05-13 LAB — CULTURE, BLOOD (ROUTINE X 2)
Culture: NO GROWTH
Culture: NO GROWTH
Special Requests: ADEQUATE

## 2018-05-15 DIAGNOSIS — N183 Chronic kidney disease, stage 3 (moderate): Secondary | ICD-10-CM | POA: Diagnosis not present

## 2018-05-15 DIAGNOSIS — I2699 Other pulmonary embolism without acute cor pulmonale: Secondary | ICD-10-CM | POA: Diagnosis not present

## 2018-05-15 DIAGNOSIS — Z6830 Body mass index (BMI) 30.0-30.9, adult: Secondary | ICD-10-CM | POA: Diagnosis not present

## 2018-05-15 DIAGNOSIS — C61 Malignant neoplasm of prostate: Secondary | ICD-10-CM | POA: Diagnosis not present

## 2018-05-15 DIAGNOSIS — I10 Essential (primary) hypertension: Secondary | ICD-10-CM | POA: Diagnosis not present

## 2018-05-15 DIAGNOSIS — I8290 Acute embolism and thrombosis of unspecified vein: Secondary | ICD-10-CM | POA: Diagnosis not present

## 2018-05-21 ENCOUNTER — Ambulatory Visit (INDEPENDENT_AMBULATORY_CARE_PROVIDER_SITE_OTHER): Payer: Medicare Other | Admitting: *Deleted

## 2018-05-21 ENCOUNTER — Ambulatory Visit: Payer: Medicare Other

## 2018-05-21 DIAGNOSIS — T63441D Toxic effect of venom of bees, accidental (unintentional), subsequent encounter: Secondary | ICD-10-CM | POA: Diagnosis not present

## 2018-05-24 DIAGNOSIS — Z23 Encounter for immunization: Secondary | ICD-10-CM | POA: Diagnosis not present

## 2018-07-10 DIAGNOSIS — C61 Malignant neoplasm of prostate: Secondary | ICD-10-CM | POA: Diagnosis not present

## 2018-07-16 ENCOUNTER — Ambulatory Visit (INDEPENDENT_AMBULATORY_CARE_PROVIDER_SITE_OTHER): Payer: Medicare Other

## 2018-07-16 DIAGNOSIS — T63441D Toxic effect of venom of bees, accidental (unintentional), subsequent encounter: Secondary | ICD-10-CM | POA: Diagnosis not present

## 2018-07-17 DIAGNOSIS — C61 Malignant neoplasm of prostate: Secondary | ICD-10-CM | POA: Diagnosis not present

## 2018-08-05 DIAGNOSIS — Z125 Encounter for screening for malignant neoplasm of prostate: Secondary | ICD-10-CM | POA: Diagnosis not present

## 2018-08-05 DIAGNOSIS — I10 Essential (primary) hypertension: Secondary | ICD-10-CM | POA: Diagnosis not present

## 2018-08-05 DIAGNOSIS — R82998 Other abnormal findings in urine: Secondary | ICD-10-CM | POA: Diagnosis not present

## 2018-08-05 DIAGNOSIS — E7849 Other hyperlipidemia: Secondary | ICD-10-CM | POA: Diagnosis not present

## 2018-08-10 IMAGING — DX DG FOOT COMPLETE 3+V*R*
3 series · 3 of 3 positions shown · non-contrast
Comparison: None.

CLINICAL DATA: Pain following rolling type injury

EXAM:
RIGHT FOOT COMPLETE - 3+ VIEW

[foot ap]
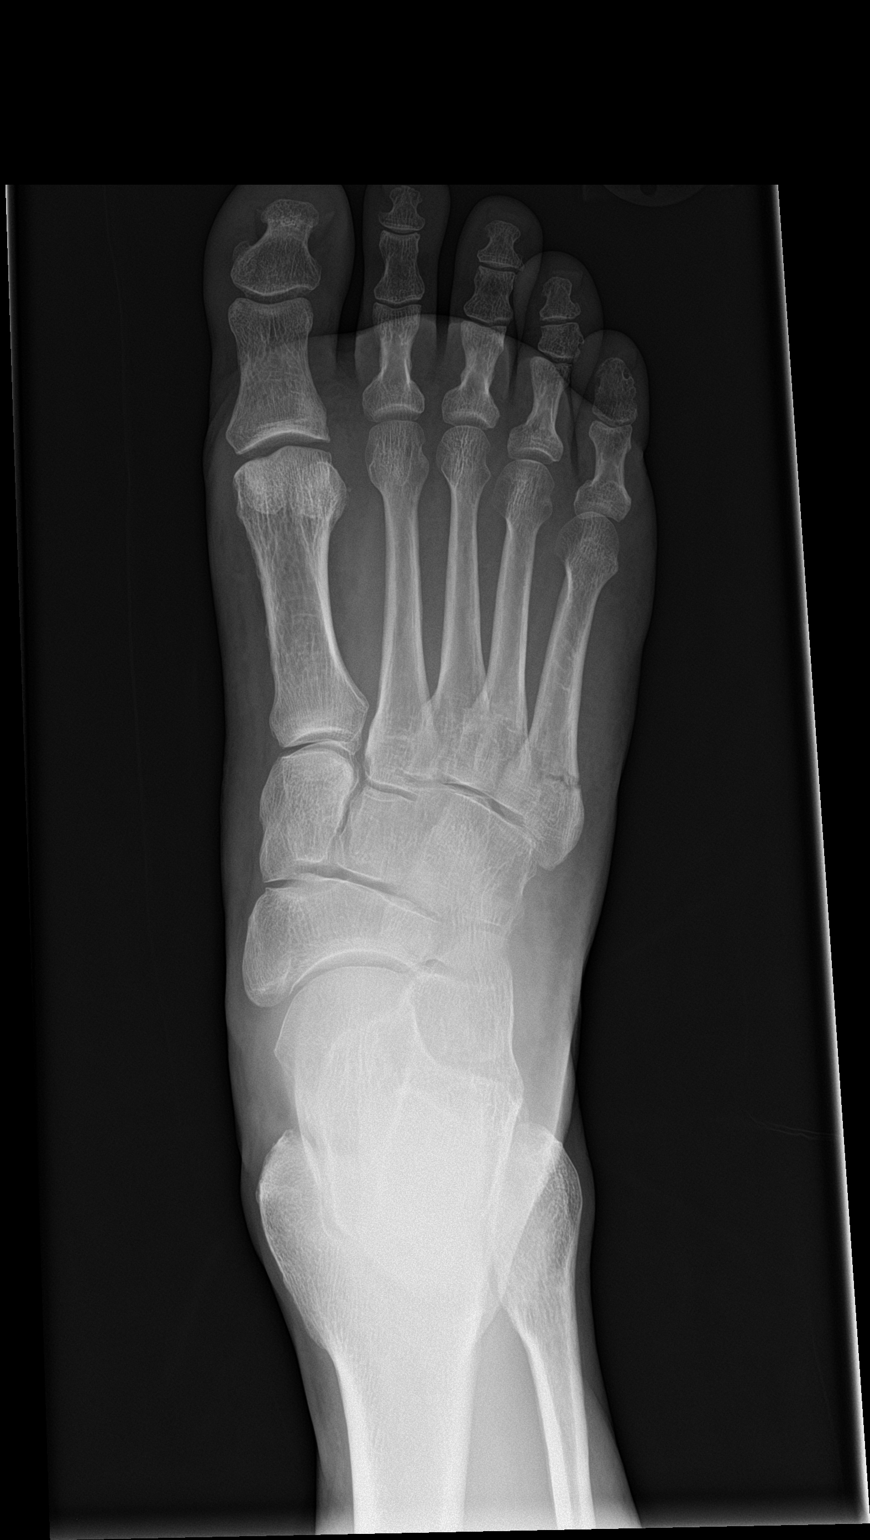

[foot obl]
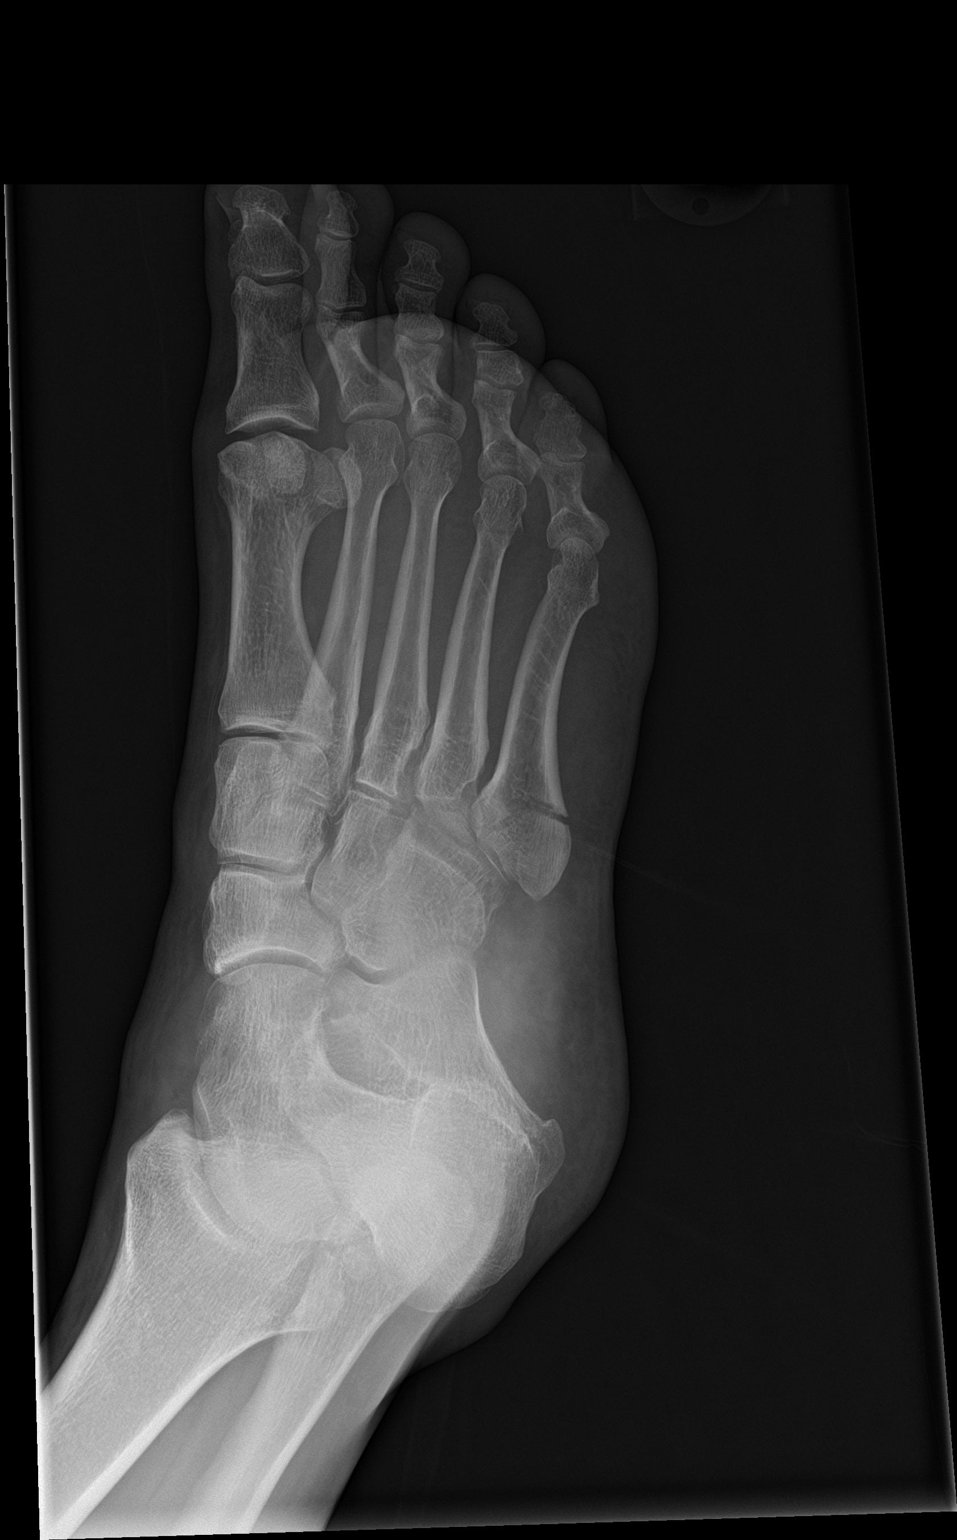

[foot lat]
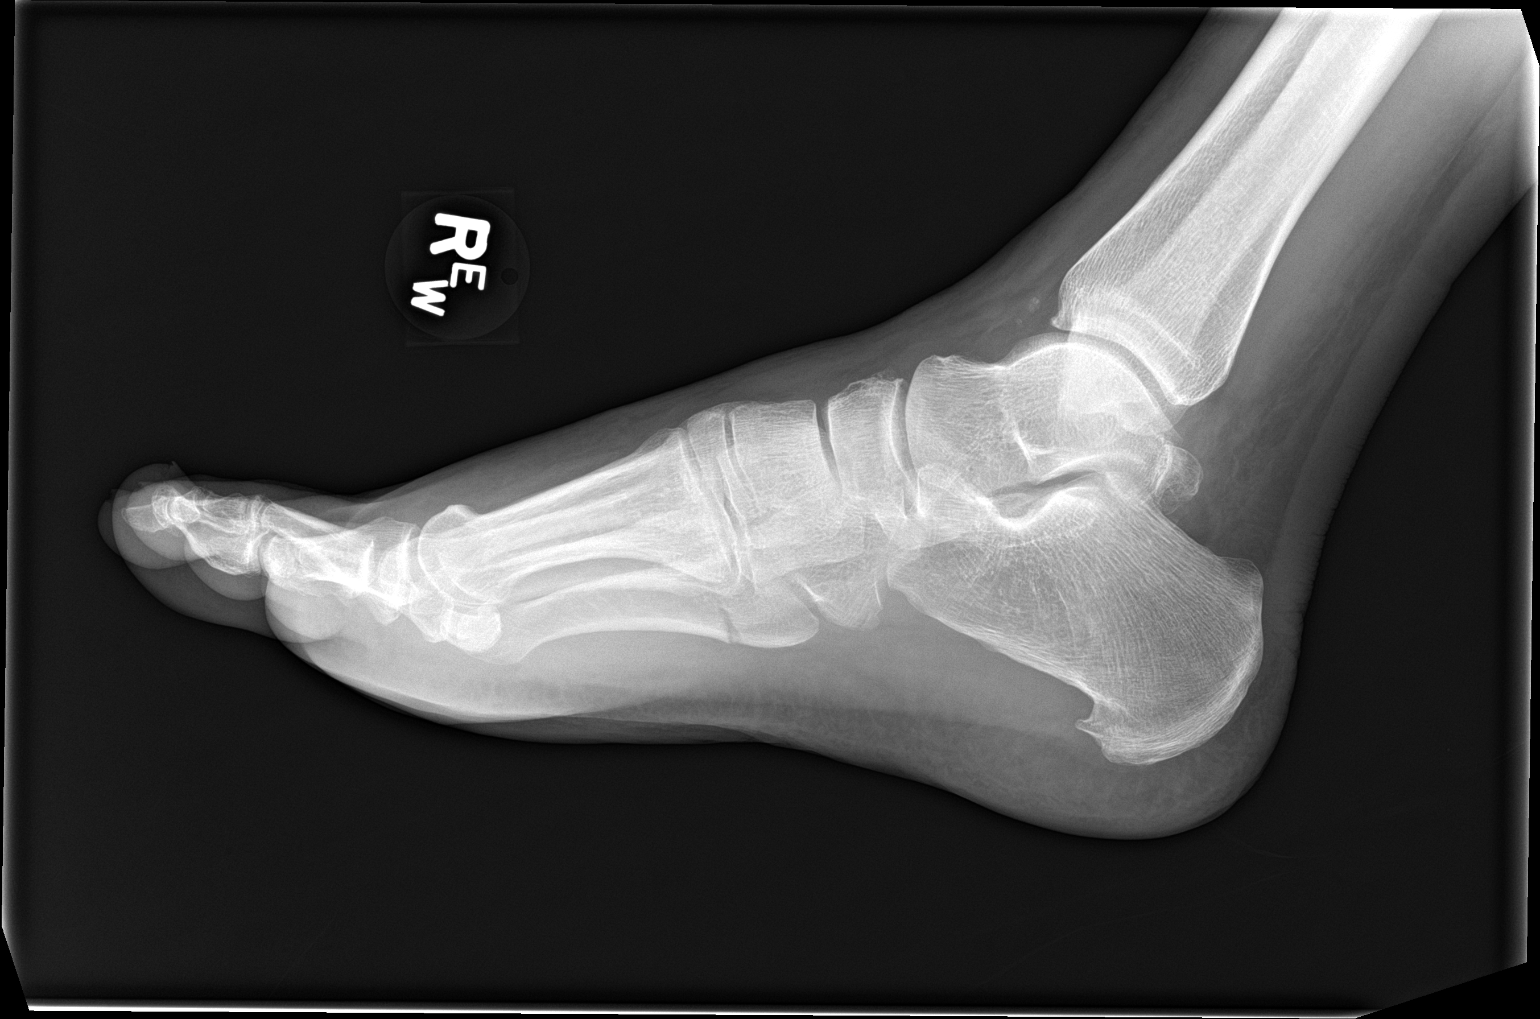

[3 of 3 positions shown; findings below may reference images not displayed]

FINDINGS: Frontal, oblique, and lateral views were obtained. There is a
transversely oriented fracture through the proximal metaphysis of
the fifth metatarsal. There is slight separation of fracture
fragments in this area. No other evident fracture. No dislocation.
Joint spaces appear normal. No erosive change. There is a spur
arising from the inferior calcaneus.
IMPRESSION: Fracture of the proximal metaphysis of the fifth metatarsal with
mild separation of fracture fragments. No other fracture. No
dislocation. No appreciable joint space narrowing. Small inferior
calcaneal spur.

## 2018-08-12 DIAGNOSIS — C61 Malignant neoplasm of prostate: Secondary | ICD-10-CM | POA: Diagnosis not present

## 2018-08-12 DIAGNOSIS — G4709 Other insomnia: Secondary | ICD-10-CM | POA: Diagnosis not present

## 2018-08-12 DIAGNOSIS — I2699 Other pulmonary embolism without acute cor pulmonale: Secondary | ICD-10-CM | POA: Diagnosis not present

## 2018-08-12 DIAGNOSIS — Z1389 Encounter for screening for other disorder: Secondary | ICD-10-CM | POA: Diagnosis not present

## 2018-08-12 DIAGNOSIS — M5030 Other cervical disc degeneration, unspecified cervical region: Secondary | ICD-10-CM | POA: Diagnosis not present

## 2018-08-12 DIAGNOSIS — K219 Gastro-esophageal reflux disease without esophagitis: Secondary | ICD-10-CM | POA: Diagnosis not present

## 2018-08-12 DIAGNOSIS — I10 Essential (primary) hypertension: Secondary | ICD-10-CM | POA: Diagnosis not present

## 2018-08-12 DIAGNOSIS — Z1331 Encounter for screening for depression: Secondary | ICD-10-CM | POA: Diagnosis not present

## 2018-08-12 DIAGNOSIS — N183 Chronic kidney disease, stage 3 (moderate): Secondary | ICD-10-CM | POA: Diagnosis not present

## 2018-08-12 DIAGNOSIS — E7849 Other hyperlipidemia: Secondary | ICD-10-CM | POA: Diagnosis not present

## 2018-08-12 DIAGNOSIS — J302 Other seasonal allergic rhinitis: Secondary | ICD-10-CM | POA: Diagnosis not present

## 2018-08-12 DIAGNOSIS — Z6831 Body mass index (BMI) 31.0-31.9, adult: Secondary | ICD-10-CM | POA: Diagnosis not present

## 2018-08-12 DIAGNOSIS — Z0000 Encounter for general adult medical examination without abnormal findings: Secondary | ICD-10-CM | POA: Diagnosis not present

## 2018-08-15 DIAGNOSIS — Z1212 Encounter for screening for malignant neoplasm of rectum: Secondary | ICD-10-CM | POA: Diagnosis not present

## 2018-09-10 ENCOUNTER — Ambulatory Visit (INDEPENDENT_AMBULATORY_CARE_PROVIDER_SITE_OTHER): Payer: Medicare Other

## 2018-09-10 DIAGNOSIS — T63441D Toxic effect of venom of bees, accidental (unintentional), subsequent encounter: Secondary | ICD-10-CM

## 2018-11-05 ENCOUNTER — Other Ambulatory Visit: Payer: Self-pay

## 2018-11-05 ENCOUNTER — Ambulatory Visit (INDEPENDENT_AMBULATORY_CARE_PROVIDER_SITE_OTHER): Payer: Medicare Other

## 2018-11-05 DIAGNOSIS — T63441D Toxic effect of venom of bees, accidental (unintentional), subsequent encounter: Secondary | ICD-10-CM | POA: Diagnosis not present

## 2018-12-23 DIAGNOSIS — R310 Gross hematuria: Secondary | ICD-10-CM | POA: Diagnosis not present

## 2018-12-24 DIAGNOSIS — K760 Fatty (change of) liver, not elsewhere classified: Secondary | ICD-10-CM | POA: Diagnosis not present

## 2018-12-24 DIAGNOSIS — N200 Calculus of kidney: Secondary | ICD-10-CM | POA: Diagnosis not present

## 2018-12-24 DIAGNOSIS — R310 Gross hematuria: Secondary | ICD-10-CM | POA: Diagnosis not present

## 2018-12-27 DIAGNOSIS — R310 Gross hematuria: Secondary | ICD-10-CM | POA: Diagnosis not present

## 2018-12-31 ENCOUNTER — Other Ambulatory Visit: Payer: Self-pay

## 2018-12-31 ENCOUNTER — Ambulatory Visit (INDEPENDENT_AMBULATORY_CARE_PROVIDER_SITE_OTHER): Payer: Medicare Other

## 2018-12-31 DIAGNOSIS — T63441D Toxic effect of venom of bees, accidental (unintentional), subsequent encounter: Secondary | ICD-10-CM

## 2019-01-20 DIAGNOSIS — M542 Cervicalgia: Secondary | ICD-10-CM | POA: Diagnosis not present

## 2019-01-20 DIAGNOSIS — M5011 Cervical disc disorder with radiculopathy,  high cervical region: Secondary | ICD-10-CM | POA: Diagnosis not present

## 2019-01-20 DIAGNOSIS — M9901 Segmental and somatic dysfunction of cervical region: Secondary | ICD-10-CM | POA: Diagnosis not present

## 2019-01-23 DIAGNOSIS — M9901 Segmental and somatic dysfunction of cervical region: Secondary | ICD-10-CM | POA: Diagnosis not present

## 2019-01-23 DIAGNOSIS — M5011 Cervical disc disorder with radiculopathy,  high cervical region: Secondary | ICD-10-CM | POA: Diagnosis not present

## 2019-01-23 DIAGNOSIS — M542 Cervicalgia: Secondary | ICD-10-CM | POA: Diagnosis not present

## 2019-01-27 DIAGNOSIS — C61 Malignant neoplasm of prostate: Secondary | ICD-10-CM | POA: Diagnosis not present

## 2019-01-27 DIAGNOSIS — M5011 Cervical disc disorder with radiculopathy,  high cervical region: Secondary | ICD-10-CM | POA: Diagnosis not present

## 2019-01-27 DIAGNOSIS — M9901 Segmental and somatic dysfunction of cervical region: Secondary | ICD-10-CM | POA: Diagnosis not present

## 2019-01-27 DIAGNOSIS — M542 Cervicalgia: Secondary | ICD-10-CM | POA: Diagnosis not present

## 2019-01-30 DIAGNOSIS — M9901 Segmental and somatic dysfunction of cervical region: Secondary | ICD-10-CM | POA: Diagnosis not present

## 2019-01-30 DIAGNOSIS — M542 Cervicalgia: Secondary | ICD-10-CM | POA: Diagnosis not present

## 2019-01-30 DIAGNOSIS — M5011 Cervical disc disorder with radiculopathy,  high cervical region: Secondary | ICD-10-CM | POA: Diagnosis not present

## 2019-02-03 DIAGNOSIS — C61 Malignant neoplasm of prostate: Secondary | ICD-10-CM | POA: Diagnosis not present

## 2019-02-03 DIAGNOSIS — N3041 Irradiation cystitis with hematuria: Secondary | ICD-10-CM | POA: Diagnosis not present

## 2019-02-10 DIAGNOSIS — M5011 Cervical disc disorder with radiculopathy,  high cervical region: Secondary | ICD-10-CM | POA: Diagnosis not present

## 2019-02-10 DIAGNOSIS — M542 Cervicalgia: Secondary | ICD-10-CM | POA: Diagnosis not present

## 2019-02-10 DIAGNOSIS — M9901 Segmental and somatic dysfunction of cervical region: Secondary | ICD-10-CM | POA: Diagnosis not present

## 2019-02-12 DIAGNOSIS — M9901 Segmental and somatic dysfunction of cervical region: Secondary | ICD-10-CM | POA: Diagnosis not present

## 2019-02-12 DIAGNOSIS — M542 Cervicalgia: Secondary | ICD-10-CM | POA: Diagnosis not present

## 2019-02-12 DIAGNOSIS — M5011 Cervical disc disorder with radiculopathy,  high cervical region: Secondary | ICD-10-CM | POA: Diagnosis not present

## 2019-02-14 DIAGNOSIS — G4700 Insomnia, unspecified: Secondary | ICD-10-CM | POA: Diagnosis not present

## 2019-02-14 DIAGNOSIS — I2699 Other pulmonary embolism without acute cor pulmonale: Secondary | ICD-10-CM | POA: Diagnosis not present

## 2019-02-14 DIAGNOSIS — I959 Hypotension, unspecified: Secondary | ICD-10-CM | POA: Diagnosis not present

## 2019-02-14 DIAGNOSIS — I10 Essential (primary) hypertension: Secondary | ICD-10-CM | POA: Diagnosis not present

## 2019-02-14 DIAGNOSIS — E785 Hyperlipidemia, unspecified: Secondary | ICD-10-CM | POA: Diagnosis not present

## 2019-02-14 DIAGNOSIS — N183 Chronic kidney disease, stage 3 (moderate): Secondary | ICD-10-CM | POA: Diagnosis not present

## 2019-02-14 DIAGNOSIS — I129 Hypertensive chronic kidney disease with stage 1 through stage 4 chronic kidney disease, or unspecified chronic kidney disease: Secondary | ICD-10-CM | POA: Diagnosis not present

## 2019-02-14 DIAGNOSIS — C61 Malignant neoplasm of prostate: Secondary | ICD-10-CM | POA: Diagnosis not present

## 2019-02-14 DIAGNOSIS — K219 Gastro-esophageal reflux disease without esophagitis: Secondary | ICD-10-CM | POA: Diagnosis not present

## 2019-02-17 DIAGNOSIS — M9901 Segmental and somatic dysfunction of cervical region: Secondary | ICD-10-CM | POA: Diagnosis not present

## 2019-02-17 DIAGNOSIS — M5011 Cervical disc disorder with radiculopathy,  high cervical region: Secondary | ICD-10-CM | POA: Diagnosis not present

## 2019-02-17 DIAGNOSIS — M542 Cervicalgia: Secondary | ICD-10-CM | POA: Diagnosis not present

## 2019-02-20 ENCOUNTER — Encounter: Payer: Self-pay | Admitting: Cardiology

## 2019-02-20 DIAGNOSIS — M9901 Segmental and somatic dysfunction of cervical region: Secondary | ICD-10-CM | POA: Diagnosis not present

## 2019-02-20 DIAGNOSIS — M542 Cervicalgia: Secondary | ICD-10-CM | POA: Diagnosis not present

## 2019-02-20 DIAGNOSIS — M5011 Cervical disc disorder with radiculopathy,  high cervical region: Secondary | ICD-10-CM | POA: Diagnosis not present

## 2019-02-24 ENCOUNTER — Ambulatory Visit (INDEPENDENT_AMBULATORY_CARE_PROVIDER_SITE_OTHER): Payer: Medicare Other | Admitting: Cardiology

## 2019-02-24 ENCOUNTER — Ambulatory Visit: Payer: Medicare Other

## 2019-02-24 ENCOUNTER — Other Ambulatory Visit: Payer: Self-pay

## 2019-02-24 ENCOUNTER — Encounter: Payer: Self-pay | Admitting: Cardiology

## 2019-02-24 VITALS — BP 150/89 | HR 72 | Ht 75.0 in | Wt 235.0 lb

## 2019-02-24 DIAGNOSIS — R55 Syncope and collapse: Secondary | ICD-10-CM | POA: Diagnosis not present

## 2019-02-24 DIAGNOSIS — R42 Dizziness and giddiness: Secondary | ICD-10-CM | POA: Diagnosis not present

## 2019-02-24 DIAGNOSIS — Z86711 Personal history of pulmonary embolism: Secondary | ICD-10-CM

## 2019-02-24 DIAGNOSIS — Z86718 Personal history of other venous thrombosis and embolism: Secondary | ICD-10-CM

## 2019-02-24 DIAGNOSIS — I440 Atrioventricular block, first degree: Secondary | ICD-10-CM | POA: Diagnosis not present

## 2019-02-24 DIAGNOSIS — I10 Essential (primary) hypertension: Secondary | ICD-10-CM

## 2019-02-24 DIAGNOSIS — E7800 Pure hypercholesterolemia, unspecified: Secondary | ICD-10-CM | POA: Diagnosis not present

## 2019-02-24 NOTE — Progress Notes (Signed)
Primary Physician:  Prince Solian, MD   Patient ID: [name], male    DOB: Jun 08, 1951, 68 y.o.   MRN: [id number]  Subjective:    Chief Complaint  Patient presents with  . Hypotension  . New Patient (Initial Visit)    HPI: [name]  is a 68 y.o. male  with history of prostate cancer in 2015 status post prostatectomy, history of lower extremity DVT in 2012 and right lower extremity DVT with bilateral PE in 2019, hyperlipidemia, hypertension, referred to [place] by Dr. Jonelle Sports for evaluation of hypotension.  For the last several months, patient has had episodes of dizziness particularly with walking outside, positional changes, and getting hot and has noted associated hypotension.  He does also noticed decreased energy.  No chest pain or shortness of breath. He was previously on olmesartan hydrochlorothiazide, in which he discontinued due to hypotension and symptoms have somewhat improved. He does report one episode of syncope, 1 year ago after working outside at [employer].   He has history of PVC's that has been controlled with metoprolol. Hyperlipidemia is well controlled.   No former tobacco or alcohol use. He has recently retired, but remains fairly active.   Past Medical History:  Diagnosis Date  . 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 Dysrhythmia    PVC'S  . Frequent PVCs   . GERD (gastroesophageal reflux disease)   . H/O hiatal hernia   . Hearing loss   . Hyperlipemia   . Hypertension   . Paroxysmal SVT (supraventricular tachycardia) ([place])   . Prostate cancer ([place]) 01/13/14   Gleason 4+4=8  . S/P radiation therapy 06/17/2014 through 08/04/2014                                                      Prostate bed 6600 cGy in 33 sessions                           Past Surgical History:  Procedure Laterality Date  . BACK SURGERY  2004   lumb lam  . COLONOSCOPY    . HERNIA REPAIR  2006   rt ing h  .  KNEE SURGERY Left    arthroscopy  . LYMPHADENECTOMY Bilateral 03/19/2014   Procedure: LYMPHADENECTOMY;  Surgeon: Raynelle Bring, MD;  Location: [facility];  Service: Urology;  Laterality: Bilateral;  . PROSTATE BIOPSY  01/09/14   glaeson 4+4=8  . ROBOT ASSISTED LAPAROSCOPIC RADICAL PROSTATECTOMY N/A 03/19/2014   Procedure: ROBOTIC ASSISTED LAPAROSCOPIC RADICAL PROSTATECTOMY LEVEL 3;  Surgeon: Raynelle Bring, MD;  Location: [facility];  Service: Urology;  Laterality: N/A;    Social History   Socioeconomic History  . Marital status: Married    Spouse name: Not on file  . Number of children: 0  . Years of education: Not on file  . Highest education level: Not on file  Occupational History    Employer: [employer]  . Financial resource strain: Not on file  . Food insecurity    Worry: Not on file    Inability: Not on file  . Transportation needs    Medical: Not on file    Non-medical: Not on file  Tobacco Use  .  Smoking status: Never Smoker  . Smokeless tobacco: Never Used  Substance and Sexual Activity  . Alcohol use: Yes    Comment: RARELY  . Drug use: No  . Sexual activity: Not on file  Lifestyle  . Physical activity    Days per week: Not on file    Minutes per session: Not on file  . Stress: Not on file  Relationships  . Social Herbalist on phone: Not on file    Gets together: Not on file    Attends religious service: Not on file    Active member of club or organization: Not on file    Attends meetings of clubs or organizations: Not on file    Relationship status: Not on file  . Intimate partner violence    Fear of current or ex partner: Not on file    Emotionally abused: Not on file    Physically abused: Not on file    Forced sexual activity: Not on file  Other Topics Concern  . Not on file  Social History Narrative  . Not on file    Review of Systems  Constitution: Positive for malaise/fatigue. Negative for decreased appetite, weight  gain and weight loss.  Eyes: Negative for visual disturbance.  Cardiovascular: Positive for near-syncope. Negative for chest pain, claudication, dyspnea on exertion, leg swelling, orthopnea, palpitations and syncope.  Respiratory: Negative for hemoptysis and wheezing.   Endocrine: Negative for cold intolerance and heat intolerance.  Hematologic/Lymphatic: Negative for bleeding problem. Does not bruise/bleed easily.  Skin: Negative for nail changes.  Musculoskeletal: Negative for muscle weakness and myalgias.  Gastrointestinal: Negative for abdominal pain, change in bowel habit, nausea and vomiting.  Neurological: Negative for difficulty with concentration, dizziness, focal weakness and headaches.  Psychiatric/Behavioral: Negative for altered mental status and suicidal ideas.  All other systems reviewed and are negative.     Objective:  Blood pressure (!) 150/89, pulse 72, height '6\' 3"'$  (1.905 m), weight 235 lb (106.6 kg), SpO2 97 %. Body mass index is 29.37 kg/m.    Physical Exam  Constitutional: He is oriented to person, place, and time. Vital signs are normal. He appears well-developed and well-nourished.  HENT:  Head: Normocephalic and atraumatic.  Neck: Normal range of motion.  Cardiovascular: Normal rate, regular rhythm, normal heart sounds and intact distal pulses.  Pulmonary/Chest: Effort normal and breath sounds normal. No accessory muscle usage. No respiratory distress.  Abdominal: Soft. Bowel sounds are normal.  Musculoskeletal: Normal range of motion.  Neurological: He is alert and oriented to person, place, and time.  Skin: Skin is warm and dry.  Vitals reviewed.  Radiology: No results found.  Laboratory examination:   PCP labs 02/14/2019: Creatinine 1.3, EGFR 84/66, potassium 4.5, alkaline Foss 36, CMP otherwise normal.  CBC normal.  PCP labs 08/05/2018: Total cholesterol 162, HDL 44, LDL 86.  CBC normal. CMP Latest Ref Rng & Units 05/10/2018 05/09/2018 05/08/2018   Glucose 70 - 99 mg/dL 97 103(H) 106(H)  BUN 8 - 23 mg/dL 22 24(H) 26(H)  Creatinine 0.61 - 1.24 mg/dL 1.39(H) 1.54(H) 1.67(H)  Sodium 135 - 145 mmol/L 136 137 137  Potassium 3.5 - 5.1 mmol/L 3.8 3.9 4.0  Chloride 98 - 111 mmol/L 101 104 99  CO2 22 - 32 mmol/L '24 24 27  '$ Calcium 8.9 - 10.3 mg/dL 9.0 9.5 10.0  Total Protein 6.5 - 8.1 g/dL - 7.4 7.5  Total Bilirubin 0.3 - 1.2 mg/dL - 1.0 0.6  Alkaline Phos 38 -  126 U/L - 37(L) 36(L)  AST 15 - 41 U/L - 47(H) 37  ALT 0 - 44 U/L - 43 35   CBC Latest Ref Rng & Units 05/10/2018 05/09/2018 05/08/2018  WBC 4.0 - 10.5 K/uL 10.2 11.7(H) 13.4(H)  Hemoglobin 13.0 - 17.0 g/dL 12.1(L) 12.6(L) 13.1  Hematocrit 39.0 - 52.0 % 37.3(L) 38.0(L) 38.8(L)  Platelets 150 - 400 K/uL 192 239 251   Lipid Panel  No results found for: CHOL, TRIG, HDL, CHOLHDL, VLDL, LDLCALC, LDLDIRECT HEMOGLOBIN A1C No results found for: HGBA1C, MPG TSH No results for input(s): TSH in the last 8760 hours.  PRN Meds:. Medications Discontinued During This Encounter  Medication Reason  . olmesartan-hydrochlorothiazide (BENICAR HCT) 40-12.5 MG tablet   . Rivaroxaban 15 & 20 MG TBPK   . acetaminophen (TYLENOL) 325 MG tablet Entry Error   Current Meds  Medication Sig  . EPINEPHrine (EPIPEN 2-PAK) 0.3 mg/0.3 mL IJ SOAJ injection Inject 0.3 mLs (0.3 mg total) into the muscle once.  Marland Kitchen esomeprazole (NEXIUM) 40 MG capsule Take 40 mg by mouth daily before breakfast.    . eszopiclone (LUNESTA) 1 MG TABS tablet Take 1 mg by mouth at bedtime as needed for sleep. Take immediately before bedtime  . fenofibrate (TRICOR) 145 MG tablet Take 145 mg by mouth every morning.   . metoprolol succinate (TOPROL-XL) 50 MG 24 hr tablet Take 50 mg by mouth every morning. Take with or immediately following a meal.  . olmesartan-hydrochlorothiazide (BENICAR HCT) 40-12.5 MG tablet Take 1 tablet by mouth daily.  Marland Kitchen omega-3 acid ethyl esters (LOVAZA) 1 G capsule Take 1 g by mouth daily.   . simvastatin (ZOCOR)  40 MG tablet Take 40 mg by mouth every morning.   [name] 20 MG TABS tablet daily.    Cardiac Studies:   Echo 06/05/2018: Left ventricle: The cavity size was normal. Wall thickness was   increased in a pattern of mild LVH. Systolic function was normal.   The estimated ejection fraction was in the range of 60% to 65%. - Right ventricle: Systolic function was mildly reduced.  Assessment:     ICD-10-CM   1. Postural dizziness with near syncope  R42    R55   2. Essential hypertension  I10 EKG 12-Lead  3. History of pulmonary embolism  Z86.711   4. History of recurrent deep vein thrombosis (DVT)  Z86.718   5. Pure hypercholesterolemia  E78.00   6. First degree AV block  I44.0     EKG 02/24/2019: Normal sinus rhythm at 72 bpm with first degree AV block,  left atrial enlargement, normal axis, no evidence of ischemia.  Recommendations:   Over the last few months, patient has had episodes of positional near syncope related to working outside in the heat, with one episode of syncope 1 year ago also after working in the heat. He is noted to have first degree AV block. No evidence of RV strain by EKG. He has had some episodes of hypotension with systolic in the 02'D that were asymptomatic. Symptoms are suggestive of heat exhaustion or heat related syncope. Given his history of pulmonary embolism, will need to exclude any RV failure. Will obtain echocardiogram for further evaluation. No symptoms of chest pain. He has previously had negative coronary angiogram in 2004. Do not feel that he needs stress testing at this time. I will place him on 2 week event monitor to exclude any arrhythmic etiology. He is allergic to monitor strips, will use sensitive skin strips.  Also has been found to have allergy to aqua sonic gel. I have asked him to take Benadryl 25 mg the evening and day of his echo.   Advised him to stay well hydrated. I have also educated him on counter pressure manuevers to help with  preventing episodes. Discussed also him using support stockings that can help with both near syncope and also preventing varicosities from previous DVT. His blood pressure is elevated today, will continue to monitor. No changes were made to medications today. He reports lipids are well controlled. He will need life long anticoagulation in view of recurrent DVT. We will see him back after the test for further recommendations and reevaluation.    *I have discussed this case with Dr. Virgina Jock and he personally examined the patient and participated in formulating the plan.*    Miquel Dunn, MSN, APRN, FNP-C [facility]. [place] Office: [phone] Fax: [phone]

## 2019-02-25 ENCOUNTER — Ambulatory Visit (INDEPENDENT_AMBULATORY_CARE_PROVIDER_SITE_OTHER): Payer: Medicare Other

## 2019-02-25 DIAGNOSIS — M9901 Segmental and somatic dysfunction of cervical region: Secondary | ICD-10-CM | POA: Diagnosis not present

## 2019-02-25 DIAGNOSIS — M542 Cervicalgia: Secondary | ICD-10-CM | POA: Diagnosis not present

## 2019-02-25 DIAGNOSIS — T63441D Toxic effect of venom of bees, accidental (unintentional), subsequent encounter: Secondary | ICD-10-CM

## 2019-02-25 DIAGNOSIS — M5011 Cervical disc disorder with radiculopathy,  high cervical region: Secondary | ICD-10-CM | POA: Diagnosis not present

## 2019-03-03 DIAGNOSIS — M542 Cervicalgia: Secondary | ICD-10-CM | POA: Diagnosis not present

## 2019-03-03 DIAGNOSIS — M9901 Segmental and somatic dysfunction of cervical region: Secondary | ICD-10-CM | POA: Diagnosis not present

## 2019-03-03 DIAGNOSIS — M5011 Cervical disc disorder with radiculopathy,  high cervical region: Secondary | ICD-10-CM | POA: Diagnosis not present

## 2019-03-03 IMAGING — CR DG CHEST 2V
2 series · 2 of 2 positions shown · non-contrast
Comparison: 03/06/2014

CLINICAL DATA: Short of breath

EXAM:
CHEST - 2 VIEW

[w chest pa]
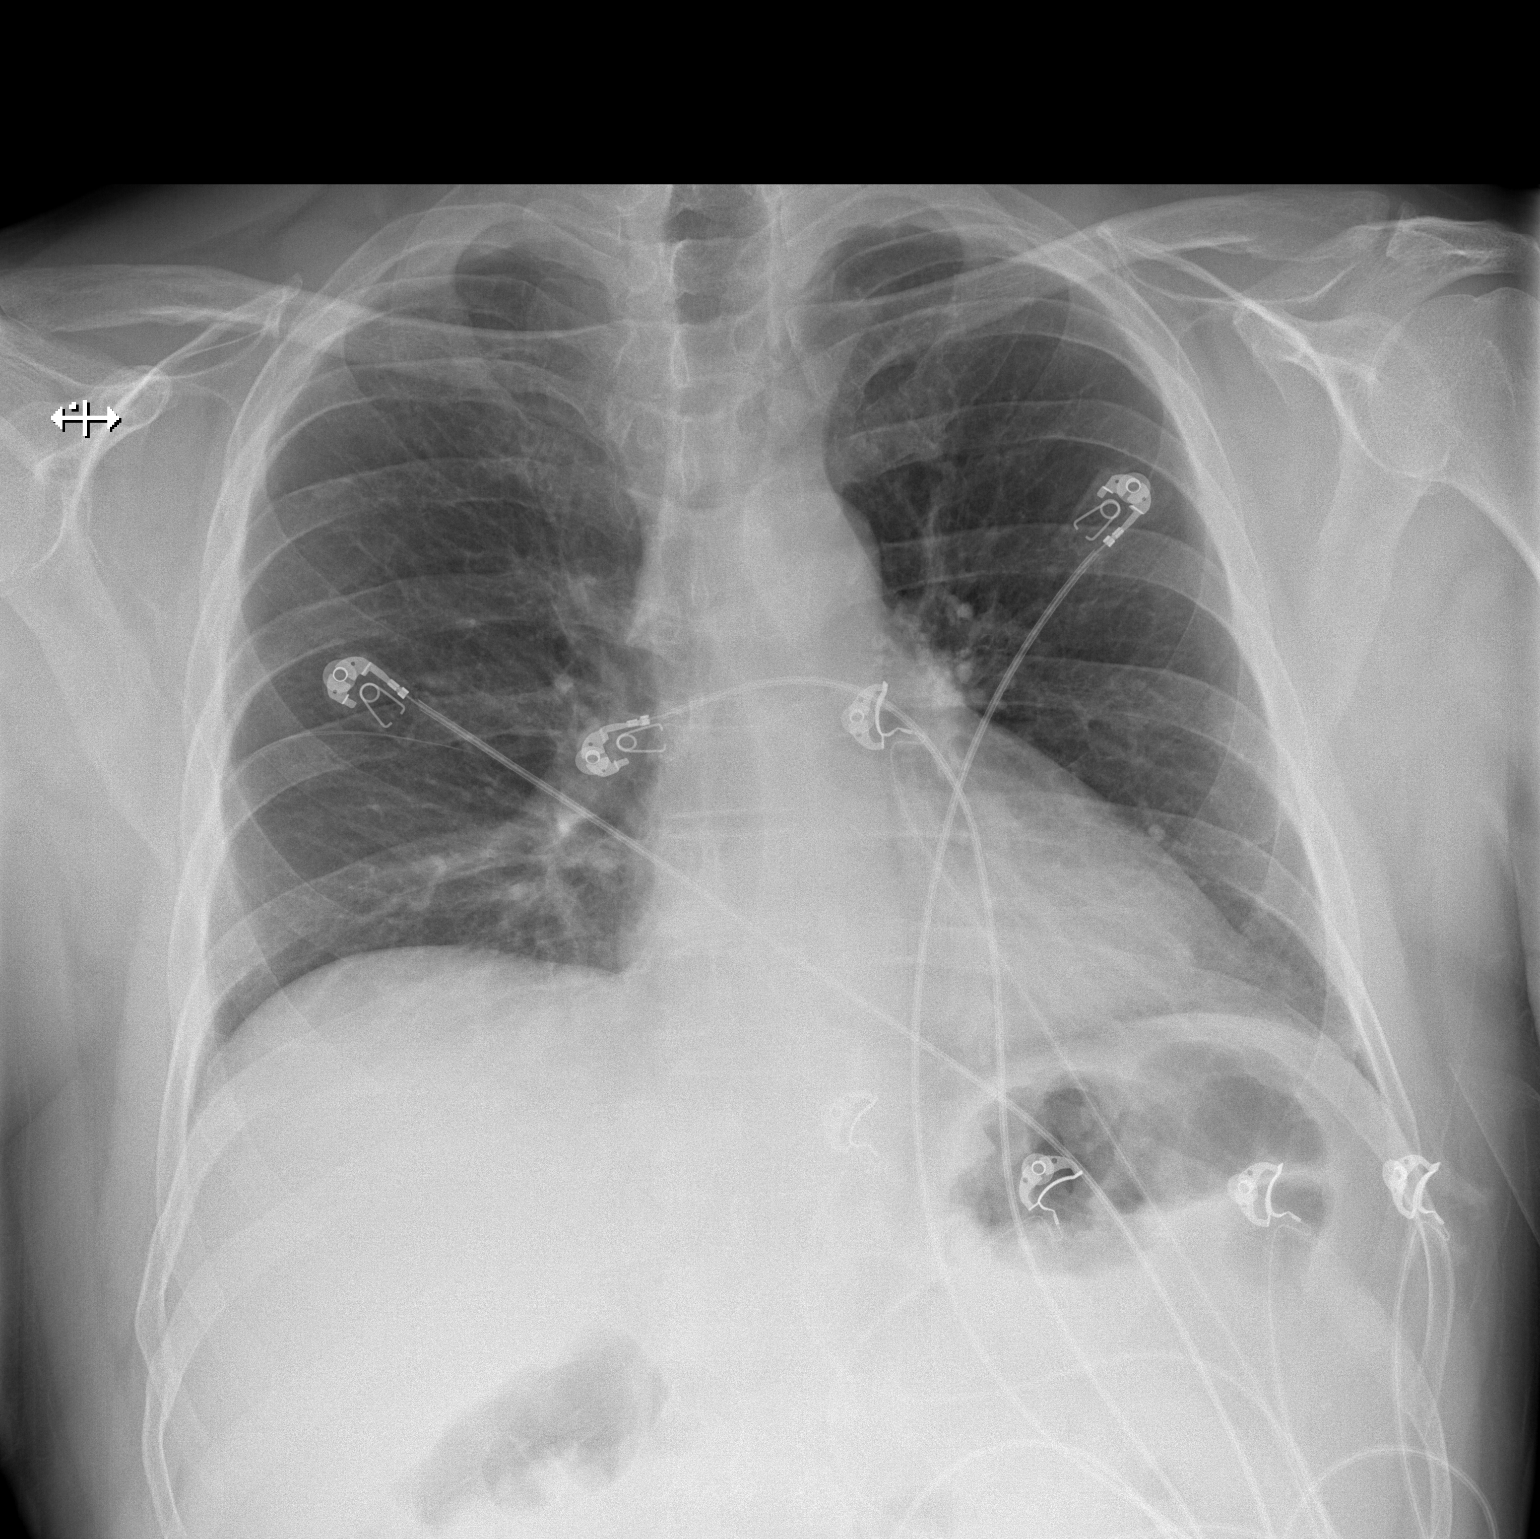

[w chest lat]
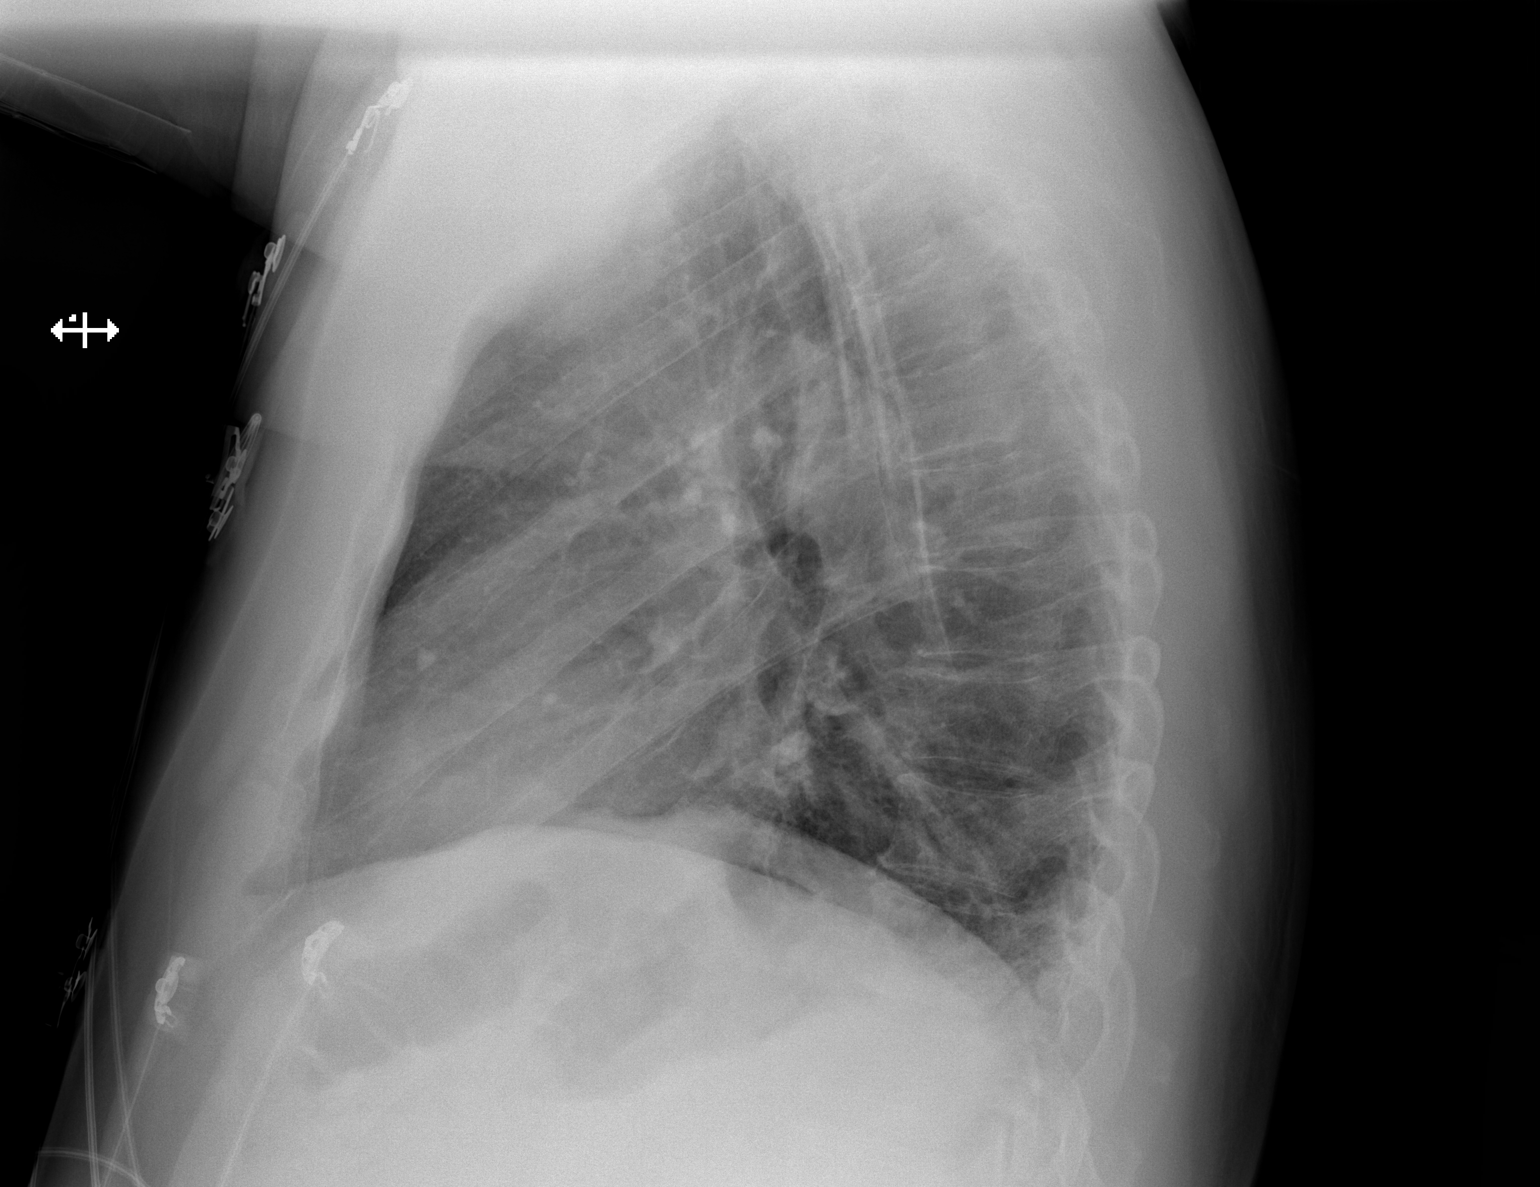

[2 of 2 positions shown; findings below may reference images not displayed]

FINDINGS: Postsurgical changes in the cervical spine. No focal opacity or
pleural effusion. Normal heart size. No pneumothorax. Calcified
granuloma/nodes in the left hilum.
IMPRESSION: No active cardiopulmonary disease.

## 2019-03-11 DIAGNOSIS — M9901 Segmental and somatic dysfunction of cervical region: Secondary | ICD-10-CM | POA: Diagnosis not present

## 2019-03-11 DIAGNOSIS — M5011 Cervical disc disorder with radiculopathy,  high cervical region: Secondary | ICD-10-CM | POA: Diagnosis not present

## 2019-03-11 DIAGNOSIS — M542 Cervicalgia: Secondary | ICD-10-CM | POA: Diagnosis not present

## 2019-03-18 DIAGNOSIS — M542 Cervicalgia: Secondary | ICD-10-CM | POA: Diagnosis not present

## 2019-03-18 DIAGNOSIS — M5011 Cervical disc disorder with radiculopathy,  high cervical region: Secondary | ICD-10-CM | POA: Diagnosis not present

## 2019-03-18 DIAGNOSIS — M9901 Segmental and somatic dysfunction of cervical region: Secondary | ICD-10-CM | POA: Diagnosis not present

## 2019-03-21 ENCOUNTER — Encounter: Payer: Self-pay | Admitting: Cardiology

## 2019-03-25 ENCOUNTER — Ambulatory Visit (INDEPENDENT_AMBULATORY_CARE_PROVIDER_SITE_OTHER): Payer: Medicare Other

## 2019-03-25 ENCOUNTER — Encounter: Payer: Self-pay | Admitting: Cardiology

## 2019-03-25 ENCOUNTER — Other Ambulatory Visit: Payer: Self-pay

## 2019-03-25 ENCOUNTER — Ambulatory Visit (INDEPENDENT_AMBULATORY_CARE_PROVIDER_SITE_OTHER): Payer: Medicare Other | Admitting: Cardiology

## 2019-03-25 VITALS — BP 149/82 | HR 65 | Ht 75.0 in | Wt 237.0 lb

## 2019-03-25 DIAGNOSIS — R55 Syncope and collapse: Secondary | ICD-10-CM

## 2019-03-25 DIAGNOSIS — Z86718 Personal history of other venous thrombosis and embolism: Secondary | ICD-10-CM | POA: Diagnosis not present

## 2019-03-25 DIAGNOSIS — I10 Essential (primary) hypertension: Secondary | ICD-10-CM

## 2019-03-25 DIAGNOSIS — Z86711 Personal history of pulmonary embolism: Secondary | ICD-10-CM

## 2019-03-25 DIAGNOSIS — R42 Dizziness and giddiness: Secondary | ICD-10-CM

## 2019-03-25 DIAGNOSIS — M5011 Cervical disc disorder with radiculopathy,  high cervical region: Secondary | ICD-10-CM | POA: Diagnosis not present

## 2019-03-25 DIAGNOSIS — M9901 Segmental and somatic dysfunction of cervical region: Secondary | ICD-10-CM | POA: Diagnosis not present

## 2019-03-25 DIAGNOSIS — M542 Cervicalgia: Secondary | ICD-10-CM | POA: Diagnosis not present

## 2019-03-25 MED ORDER — OLMESARTAN MEDOXOMIL 5 MG PO TABS: 10.0000 mg | ORAL_TABLET | Freq: Every day | ORAL | 1 refills | Status: DC

## 2019-03-25 NOTE — Progress Notes (Signed)
Primary Physician:  Prince Solian, MD   Patient ID: [name], male    DOB: [birth year], 68 y.o.   MRN: [id number]  Subjective:    Chief Complaint  Patient presents with   Hypertension   Follow-up    echo & monitor results     HPI: [name]  is a 68 y.o. male  with history of prostate cancer in 2015 status post prostatectomy, history of lower extremity DVT in 2012 and right lower extremity DVT with bilateral PE in 2019, hyperlipidemia, hypertension, recently evaluated by [place] for evaluation of postural dizziness and hypertension.  For the last several months, patient has had episodes of dizziness particularly with walking outside, positional changes, and getting hot and has noted associated hypotension.  He does also noticed decreased energy.  No chest pain or shortness of breath. He does report one episode of syncope, 1 year ago after working outside at [employer]. Since being off olmesartan HCT, symptoms of dizziness and near syncope have resolved. He underwent echocardiogram and event monitor and now presents for follow up.  Patient states that he is doing well, no complaints.   He has history of PVC's that has been controlled with metoprolol. Hyperlipidemia is well controlled.   No former tobacco or alcohol use. He has recently retired, but remains fairly active.   Past Medical History:  Diagnosis Date   DVT of lower extremity (deep venous thrombosis) (HCC)    left leg after knee surgery; DAILY ASA NOW   Dysrhythmia    PVC'S   Frequent PVCs    GERD (gastroesophageal reflux disease)    H/O hiatal hernia    Hearing loss    Hyperlipemia    Hypertension    Paroxysmal SVT (supraventricular tachycardia) ([place])    Prostate cancer ([place]) 01/13/14   Gleason 4+4=8   S/P radiation therapy 06/17/2014 through 08/04/2014                                                      Prostate bed 6600 cGy in 33 sessions                           Past Surgical History:    Procedure Laterality Date   BACK SURGERY  2004   lumb lam   COLONOSCOPY     HERNIA REPAIR  2006   rt ing h   KNEE SURGERY Left    arthroscopy   LYMPHADENECTOMY Bilateral 03/19/2014   Procedure: LYMPHADENECTOMY;  Surgeon: Raynelle Bring, MD;  Location: [facility];  Service: Urology;  Laterality: Bilateral;   PROSTATE BIOPSY  01/09/14   glaeson 4+4=8   ROBOT ASSISTED LAPAROSCOPIC RADICAL PROSTATECTOMY N/A 03/19/2014   Procedure: ROBOTIC ASSISTED LAPAROSCOPIC RADICAL PROSTATECTOMY LEVEL 3;  Surgeon: Raynelle Bring, MD;  Location: [facility];  Service: Urology;  Laterality: N/A;    Social History   Socioeconomic History   Marital status: Married    Spouse name: Not on file   Number of children: 0   Years of education: Not on file   Highest education level: Not on file  Occupational History    Employer: [employer]  Social Needs   Financial resource strain: Not on file   Food insecurity    Worry: Not on file    Inability:  Not on file   Transportation needs    Medical: Not on file    Non-medical: Not on file  Tobacco Use   Smoking status: Never Smoker   Smokeless tobacco: Never Used  Substance and Sexual Activity   Alcohol use: Yes    Comment: RARELY   Drug use: No   Sexual activity: Not on file  Lifestyle   Physical activity    Days per week: Not on file    Minutes per session: Not on file   Stress: Not on file  Relationships   Social connections    Talks on phone: Not on file    Gets together: Not on file    Attends religious service: Not on file    Active member of club or organization: Not on file    Attends meetings of clubs or organizations: Not on file    Relationship status: Not on file   Intimate partner violence    Fear of current or ex partner: Not on file    Emotionally abused: Not on file    Physically abused: Not on file    Forced sexual activity: Not on file  Other Topics Concern   Not on file  Social History Narrative    Not on file    Review of Systems  Constitution: Positive for malaise/fatigue. Negative for decreased appetite, weight gain and weight loss.  Eyes: Negative for visual disturbance.  Cardiovascular: Negative for chest pain, claudication, dyspnea on exertion, leg swelling, near-syncope, orthopnea, palpitations and syncope.  Respiratory: Negative for hemoptysis and wheezing.   Endocrine: Negative for cold intolerance and heat intolerance.  Hematologic/Lymphatic: Negative for bleeding problem. Does not bruise/bleed easily.  Skin: Negative for nail changes.  Musculoskeletal: Negative for muscle weakness and myalgias.  Gastrointestinal: Negative for abdominal pain, change in bowel habit, nausea and vomiting.  Neurological: Negative for difficulty with concentration, dizziness, focal weakness and headaches.  Psychiatric/Behavioral: Negative for altered mental status and suicidal ideas.  All other systems reviewed and are negative.     Objective:  Blood pressure (!) 149/82, pulse 65, height _0  (1.905 m), weight 237 lb (107.5 kg), SpO2 99 %. Body mass index is 29.62 kg/m.    Physical Exam  Constitutional: He is oriented to person, place, and time. Vital signs are normal. He appears well-developed and well-nourished.  HENT:  Head: Normocephalic and atraumatic.  Neck: Normal range of motion.  Cardiovascular: Normal rate, regular rhythm, normal heart sounds and intact distal pulses.  Pulmonary/Chest: Effort normal and breath sounds normal. No accessory muscle usage. No respiratory distress.  Abdominal: Soft. Bowel sounds are normal.  Musculoskeletal: Normal range of motion.  Neurological: He is alert and oriented to person, place, and time.  Skin: Skin is warm and dry.  Vitals reviewed.  Radiology: No results found.  Laboratory examination:   PCP labs 02/14/2019: Creatinine 1.3, EGFR 84/66, potassium 4.5, alkaline Foss 36, CMP otherwise normal.  CBC normal.  PCP labs 08/05/2018: Total  cholesterol 162, HDL 44, LDL 86.  CBC normal. CMP Latest Ref Rng & Units 05/10/2018 05/09/2018 05/08/2018  Glucose 70 - 99 mg/dL 97 103(H) 106(H)  BUN 8 - 23 mg/dL 22 24(H) 26(H)  Creatinine 0.61 - 1.24 mg/dL 1.39(H) 1.54(H) 1.67(H)  Sodium 135 - 145 mmol/L 136 137 137  Potassium 3.5 - 5.1 mmol/L 3.8 3.9 4.0  Chloride 98 - 111 mmol/L 101 104 99  CO2 22 - 32 mmol/L _1 Calcium 8.9 - 10.3 mg/dL 9.0 9.5 10.0  Total Protein 6.5 - 8.1 g/dL - 7.4 7.5  Total Bilirubin 0.3 - 1.2 mg/dL - 1.0 0.6  Alkaline Phos 38 - 126 U/L - 37(L) 36(L)  AST 15 - 41 U/L - 47(H) 37  ALT 0 - 44 U/L - 43 35   CBC Latest Ref Rng & Units 05/10/2018 05/09/2018 05/08/2018  WBC 4.0 - 10.5 K/uL 10.2 11.7(H) 13.4(H)  Hemoglobin 13.0 - 17.0 g/dL 12.1(L) 12.6(L) 13.1  Hematocrit 39.0 - 52.0 % 37.3(L) 38.0(L) 38.8(L)  Platelets 150 - 400 K/uL 192 239 251   Lipid Panel  No results found for: CHOL, TRIG, HDL, CHOLHDL, VLDL, LDLCALC, LDLDIRECT HEMOGLOBIN A1C No results found for: HGBA1C, MPG TSH No results for input(s): TSH in the last 8760 hours.  PRN Meds:. Medications Discontinued During This Encounter  Medication Reason   olmesartan-hydrochlorothiazide (BENICAR HCT) 40-12.5 MG tablet Discontinued by provider   Current Meds  Medication Sig   Acetaminophen 325 MG CAPS Take 2 capsules by mouth as needed.   EPINEPHrine (EPIPEN 2-PAK) 0.3 mg/0.3 mL IJ SOAJ injection Inject 0.3 mLs (0.3 mg total) into the muscle once.   esomeprazole (NEXIUM) 40 MG capsule Take 40 mg by mouth daily before breakfast.     eszopiclone (LUNESTA) 1 MG TABS tablet Take 1 mg by mouth at bedtime as needed for sleep. Take immediately before bedtime   fenofibrate (TRICOR) 145 MG tablet Take 145 mg by mouth every morning.    metoprolol succinate (TOPROL-XL) 50 MG 24 hr tablet Take 50 mg by mouth every morning. Take with or immediately following a meal.    omega-3 acid ethyl esters (LOVAZA) 1 G capsule Take 1 g by mouth daily.     simvastatin (ZOCOR) 40 MG tablet Take 40 mg by mouth every morning.    XARELTO 20 MG TABS tablet daily.    Cardiac Studies:   Echo 06/05/2018: Left ventricle: The cavity size was normal. Wall thickness was   increased in a pattern of mild LVH. Systolic function was normal.   The estimated ejection fraction was in the range of 60% to 65%. - Right ventricle: Systolic function was mildly reduced.  14 day event monitor 07/20-08/02/20: Dominant rhythm: NSR with first degree AV block. No patient reported symptoms. Tachycardia burden 3% with fastest episode on day 7 at 132 bpm for 1 h 19 m. VE burden less than 1%. No A fib, high degree AV block or SVT was noted.  Assessment:     ICD-10-CM   1. Postural dizziness with near syncope  R42    R55   2. Essential hypertension  I10   3. History of pulmonary embolism  Z86.711   4. History of recurrent deep vein thrombosis (DVT)  Z86.718     EKG 02/24/2019: Normal sinus rhythm at 72 bpm with first degree AV block,  left atrial enlargement, normal axis, no evidence of ischemia.  Recommendations:   I discussed recently obtained event monitor report with the patient, no significant arrhythmias.  He did have 3% tachycardia burden that were asymptomatic.  He has not had any further episodes of dizziness or near syncope since being off olmesartan hydrochlorothiazide.  His blood pressure does continue to be elevated.  Will try low-dose of plain olmesartan to see if he can tolerate this.  He continues to be without chest pain or shortness of breath.  He did undergo echocardiogram today, report not available at this time, will notify him of results once available.  He has previously had PE and  will need follow-up of RV strain.  I will see him back in 4 weeks for follow-up on hypertension and dizziness.   Miquel Dunn, MSN, APRN, FNP-C [facility]. [place] Office: [phone] Fax: [phone]

## 2019-03-27 ENCOUNTER — Telehealth: Payer: Self-pay | Admitting: Cardiology

## 2019-03-27 NOTE — Telephone Encounter (Signed)
Discussed echo findings with patient over the phone. RV dysfunction has resolved. Essentially normal echo.

## 2019-04-02 DIAGNOSIS — M5011 Cervical disc disorder with radiculopathy,  high cervical region: Secondary | ICD-10-CM | POA: Diagnosis not present

## 2019-04-02 DIAGNOSIS — M542 Cervicalgia: Secondary | ICD-10-CM | POA: Diagnosis not present

## 2019-04-02 DIAGNOSIS — M9901 Segmental and somatic dysfunction of cervical region: Secondary | ICD-10-CM | POA: Diagnosis not present

## 2019-04-15 DIAGNOSIS — M5011 Cervical disc disorder with radiculopathy,  high cervical region: Secondary | ICD-10-CM | POA: Diagnosis not present

## 2019-04-15 DIAGNOSIS — M542 Cervicalgia: Secondary | ICD-10-CM | POA: Diagnosis not present

## 2019-04-15 DIAGNOSIS — M9901 Segmental and somatic dysfunction of cervical region: Secondary | ICD-10-CM | POA: Diagnosis not present

## 2019-04-16 ENCOUNTER — Other Ambulatory Visit: Payer: Self-pay | Admitting: Cardiology

## 2019-04-18 DIAGNOSIS — Z86718 Personal history of other venous thrombosis and embolism: Secondary | ICD-10-CM | POA: Diagnosis not present

## 2019-04-18 DIAGNOSIS — H938X3 Other specified disorders of ear, bilateral: Secondary | ICD-10-CM | POA: Diagnosis not present

## 2019-04-18 DIAGNOSIS — H903 Sensorineural hearing loss, bilateral: Secondary | ICD-10-CM | POA: Diagnosis not present

## 2019-04-18 DIAGNOSIS — Z86711 Personal history of pulmonary embolism: Secondary | ICD-10-CM | POA: Diagnosis not present

## 2019-04-18 DIAGNOSIS — Z974 Presence of external hearing-aid: Secondary | ICD-10-CM | POA: Diagnosis not present

## 2019-04-18 DIAGNOSIS — H8102 Meniere's disease, left ear: Secondary | ICD-10-CM | POA: Diagnosis not present

## 2019-04-18 DIAGNOSIS — Z7901 Long term (current) use of anticoagulants: Secondary | ICD-10-CM | POA: Diagnosis not present

## 2019-04-22 ENCOUNTER — Ambulatory Visit (INDEPENDENT_AMBULATORY_CARE_PROVIDER_SITE_OTHER): Payer: Medicare Other

## 2019-04-22 ENCOUNTER — Other Ambulatory Visit: Payer: Self-pay

## 2019-04-22 DIAGNOSIS — T63441D Toxic effect of venom of bees, accidental (unintentional), subsequent encounter: Secondary | ICD-10-CM | POA: Diagnosis not present

## 2019-04-23 ENCOUNTER — Ambulatory Visit (INDEPENDENT_AMBULATORY_CARE_PROVIDER_SITE_OTHER): Payer: Medicare Other | Admitting: Cardiology

## 2019-04-23 ENCOUNTER — Encounter: Payer: Self-pay | Admitting: Cardiology

## 2019-04-23 VITALS — BP 128/81 | HR 72 | Temp 96.2°F | Ht 75.0 in | Wt 239.1 lb

## 2019-04-23 DIAGNOSIS — Z86711 Personal history of pulmonary embolism: Secondary | ICD-10-CM | POA: Diagnosis not present

## 2019-04-23 DIAGNOSIS — E7800 Pure hypercholesterolemia, unspecified: Secondary | ICD-10-CM | POA: Diagnosis not present

## 2019-04-23 DIAGNOSIS — Z86718 Personal history of other venous thrombosis and embolism: Secondary | ICD-10-CM

## 2019-04-23 DIAGNOSIS — I10 Essential (primary) hypertension: Secondary | ICD-10-CM | POA: Diagnosis not present

## 2019-04-23 DIAGNOSIS — I493 Ventricular premature depolarization: Secondary | ICD-10-CM

## 2019-04-23 NOTE — Progress Notes (Signed)
Primary Physician:  Prince Solian, MD   Patient ID: [name], male    DOB: 03/15/1951, 68 y.o.   MRN: [id number]  Subjective:    Chief Complaint  Patient presents with  . Hypertension  . Dizziness    resolved  . Follow-up    4wk    HPI: [name]  is a 68 y.o. male  with history of prostate cancer in 2015 status post prostatectomy, history of lower extremity DVT in 2012 and right lower extremity DVT with bilateral PE in 2019, hyperlipidemia, hypertension, recently evaluated by [place] for evaluation of postural dizziness and hypertension.  Since being off olmesartan HCT, symptoms of dizziness and near syncope have resolved. He underwent echocardiogram on 03/25/19 that showed resolution of RV failure. Normal LVEF with mild LVH.  Event monitor was without arrhythmia. Due to his blood pressure, he was started back on plain low dose Olmesartan. He now presents for follow up.  Patient states that he is doing well, no complaints. Continues to have resolution of near syncope and syncope. No chest pain or shortness of breath. He has been very active with yard work.   He has history of PVC's that has been controlled with metoprolol. Hyperlipidemia is well controlled.   No former tobacco or alcohol use. He has recently retired, but remains fairly active.   Past Medical History:  Diagnosis Date  . 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 Dysrhythmia    PVC'S  . Frequent PVCs   . GERD (gastroesophageal reflux disease)   . H/O hiatal hernia   . Hearing loss   . Hyperlipemia   . Hypertension   . Paroxysmal SVT (supraventricular tachycardia) ([place])   . Prostate cancer ([place]) 01/13/14   Gleason 4+4=8  . S/P radiation therapy 06/17/2014 through 08/04/2014                                                      Prostate bed 6600 cGy in 33 sessions                           Past Surgical History:  Procedure Laterality  Date  . BACK SURGERY  2004   lumb lam  . COLONOSCOPY    . HERNIA REPAIR  2006   rt ing h  . KNEE SURGERY Left    arthroscopy  . LYMPHADENECTOMY Bilateral 03/19/2014   Procedure: LYMPHADENECTOMY;  Surgeon: Raynelle Bring, MD;  Location: [facility];  Service: Urology;  Laterality: Bilateral;  . PROSTATE BIOPSY  01/09/14   glaeson 4+4=8  . ROBOT ASSISTED LAPAROSCOPIC RADICAL PROSTATECTOMY N/A 03/19/2014   Procedure: ROBOTIC ASSISTED LAPAROSCOPIC RADICAL PROSTATECTOMY LEVEL 3;  Surgeon: Raynelle Bring, MD;  Location: [facility];  Service: Urology;  Laterality: N/A;    Social History   Socioeconomic History  . Marital status: Married    Spouse name: Not on file  . Number of children: 0  . Years of education: Not on file  . Highest education level: Not on file  Occupational History    Employer: [employer]  . Financial resource strain: Not on file  . Food insecurity    Worry: Not on file    Inability:  Not on file  . Transportation needs    Medical: Not on file    Non-medical: Not on file  Tobacco Use  . Smoking status: Never Smoker  . Smokeless tobacco: Never Used  Substance and Sexual Activity  . Alcohol use: Yes    Comment: RARELY  . Drug use: No  . Sexual activity: Not on file  Lifestyle  . Physical activity    Days per week: Not on file    Minutes per session: Not on file  . Stress: Not on file  Relationships  . Social Herbalist on phone: Not on file    Gets together: Not on file    Attends religious service: Not on file    Active member of club or organization: Not on file    Attends meetings of clubs or organizations: Not on file    Relationship status: Not on file  . Intimate partner violence    Fear of current or ex partner: Not on file    Emotionally abused: Not on file    Physically abused: Not on file    Forced sexual activity: Not on file  Other Topics Concern  . Not on file  Social History Narrative  . Not on file    Review  of Systems  Constitution: Positive for malaise/fatigue. Negative for decreased appetite, weight gain and weight loss.  Eyes: Negative for visual disturbance.  Cardiovascular: Negative for chest pain, claudication, dyspnea on exertion, leg swelling, near-syncope, orthopnea, palpitations and syncope.  Respiratory: Negative for hemoptysis and wheezing.   Endocrine: Negative for cold intolerance and heat intolerance.  Hematologic/Lymphatic: Negative for bleeding problem. Does not bruise/bleed easily.  Skin: Negative for nail changes.  Musculoskeletal: Negative for muscle weakness and myalgias.  Gastrointestinal: Negative for abdominal pain, change in bowel habit, nausea and vomiting.  Neurological: Negative for difficulty with concentration, dizziness, focal weakness and headaches.  Psychiatric/Behavioral: Negative for altered mental status and suicidal ideas.  All other systems reviewed and are negative.     Objective:  Blood pressure 128/81, pulse 72, temperature (!) 96.2 F (35.7 C), height _0  (1.905 m), weight 239 lb 1.6 oz (108.5 kg), SpO2 98 %. Body mass index is 29.89 kg/m.    Physical Exam  Constitutional: He is oriented to person, place, and time. Vital signs are normal. He appears well-developed and well-nourished.  HENT:  Head: Normocephalic and atraumatic.  Neck: Normal range of motion.  Cardiovascular: Normal rate, regular rhythm, normal heart sounds and intact distal pulses.  Pulmonary/Chest: Effort normal and breath sounds normal. No accessory muscle usage. No respiratory distress.  Abdominal: Soft. Bowel sounds are normal.  Musculoskeletal: Normal range of motion.  Neurological: He is alert and oriented to person, place, and time.  Skin: Skin is warm and dry.  Vitals reviewed.  Radiology: No results found.  Laboratory examination:   PCP labs 02/14/2019: Creatinine 1.3, EGFR 84/66, potassium 4.5, alkaline Foss 36, CMP otherwise normal.  CBC normal.  PCP labs  08/05/2018: Total cholesterol 162, HDL 44, LDL 86.  CBC normal. CMP Latest Ref Rng & Units 05/10/2018 05/09/2018 05/08/2018  Glucose 70 - 99 mg/dL 97 103(H) 106(H)  BUN 8 - 23 mg/dL 22 24(H) 26(H)  Creatinine 0.61 - 1.24 mg/dL 1.39(H) 1.54(H) 1.67(H)  Sodium 135 - 145 mmol/L 136 137 137  Potassium 3.5 - 5.1 mmol/L 3.8 3.9 4.0  Chloride 98 - 111 mmol/L 101 104 99  CO2 22 - 32 mmol/L _1 Calcium  8.9 - 10.3 mg/dL 9.0 9.5 10.0  Total Protein 6.5 - 8.1 g/dL - 7.4 7.5  Total Bilirubin 0.3 - 1.2 mg/dL - 1.0 0.6  Alkaline Phos 38 - 126 U/L - 37(L) 36(L)  AST 15 - 41 U/L - 47(H) 37  ALT 0 - 44 U/L - 43 35   CBC Latest Ref Rng & Units 05/10/2018 05/09/2018 05/08/2018  WBC 4.0 - 10.5 K/uL 10.2 11.7(H) 13.4(H)  Hemoglobin 13.0 - 17.0 g/dL 12.1(L) 12.6(L) 13.1  Hematocrit 39.0 - 52.0 % 37.3(L) 38.0(L) 38.8(L)  Platelets 150 - 400 K/uL 192 239 251   Lipid Panel  No results found for: CHOL, TRIG, HDL, CHOLHDL, VLDL, LDLCALC, LDLDIRECT HEMOGLOBIN A1C No results found for: HGBA1C, MPG TSH No results for input(s): TSH in the last 8760 hours.  PRN Meds:. There are no discontinued medications. Current Meds  Medication Sig  . Acetaminophen 325 MG CAPS Take 2 capsules by mouth as needed.  Marland Kitchen EPINEPHrine (EPIPEN 2-PAK) 0.3 mg/0.3 mL IJ SOAJ injection Inject 0.3 mLs (0.3 mg total) into the muscle once.  Marland Kitchen esomeprazole (NEXIUM) 40 MG capsule Take 40 mg by mouth daily before breakfast.    . eszopiclone (LUNESTA) 1 MG TABS tablet Take 1 mg by mouth at bedtime as needed for sleep. Take immediately before bedtime  . fenofibrate (TRICOR) 145 MG tablet Take 145 mg by mouth every morning.   . metoprolol succinate (TOPROL-XL) 50 MG 24 hr tablet Take 50 mg by mouth every morning. Take with or immediately following a meal.   . Multiple Vitamins-Minerals (CENTRUM SILVER 50+MEN PO) Take by mouth daily.  Marland Kitchen olmesartan (BENICAR) 5 MG tablet TAKE 2 TABLETS BY MOUTH EVERY DAY  . omega-3 acid ethyl esters (LOVAZA) 1 G  capsule Take 1 g by mouth daily.   . simvastatin (ZOCOR) 40 MG tablet Take 40 mg by mouth every morning.   Alveda Reasons 20 MG TABS tablet daily.    Cardiac Studies:   Echocardiogram 03/25/2019: Left ventricle cavity is normal in size. Normal left ventricular wall thickness. Normal global wall motion. Normal LV systolic function with EF 55%. Doppler evidence of grade I (impaired) diastolic dysfunction, normal LAP.  Left atrial cavity is mildly dilated. Mild (Grade I) mitral regurgitation. Mild tricuspid regurgitation. Estimated pulmonary artery systolic pressure is 27 mmHg.  14 day event monitor 07/20-08/02/20: Dominant rhythm: NSR with first degree AV block. No patient reported symptoms. Tachycardia burden 3% with fastest episode on day 7 at 132 bpm for 1 h 19 m. VE burden less than 1%. No A fib, high degree AV block or SVT was noted.  Assessment:     ICD-10-CM   1. Essential hypertension  I10   2. History of pulmonary embolism  Z86.711   3. History of recurrent deep vein thrombosis (DVT)  Z86.718   4. Pure hypercholesterolemia  E78.00   5. PVC (premature ventricular contraction)  I49.3     EKG 02/24/2019: Normal sinus rhythm at 72 bpm with first degree AV block,  left atrial enlargement, normal axis, no evidence of ischemia.  Recommendations:   Since being on low-dose olmesartan, his blood pressure has significantly improved and he has not had recurrence of near syncope or syncope.  He is overall feeling great.  He has occasional PVCs particularly with emotional upset, but is otherwise asymptomatic.  He is on metoprolol 50 mg, recommend that he continue with this.  He is able to exercise and do heavy yard work for several hours without any exertional  difficulty.  Do not feel he need stress testing at this point.  I discussed echocardiogram that was recently performed, he has had improvement in RV failure compared to his previous echocardiogram.  As his symptoms have improved, I will see  him back on a as needed basis, but encouraged him to contact me for any new or worsening problems.  He will continue to follow-up with PCP for primary and secondary prevention measures.   Miquel Dunn, MSN, APRN, FNP-C [facility]. [place] Office: [phone] Fax: [phone]

## 2019-04-24 ENCOUNTER — Other Ambulatory Visit: Payer: Self-pay

## 2019-04-24 ENCOUNTER — Encounter: Payer: Self-pay | Admitting: Allergy

## 2019-04-24 ENCOUNTER — Ambulatory Visit (INDEPENDENT_AMBULATORY_CARE_PROVIDER_SITE_OTHER): Payer: Medicare Other | Admitting: Allergy

## 2019-04-24 VITALS — BP 130/78 | HR 73 | Temp 96.9°F | Resp 16 | Ht 75.0 in | Wt 238.8 lb

## 2019-04-24 DIAGNOSIS — T63441A Toxic effect of venom of bees, accidental (unintentional), initial encounter: Secondary | ICD-10-CM | POA: Insufficient documentation

## 2019-04-24 DIAGNOSIS — T63441D Toxic effect of venom of bees, accidental (unintentional), subsequent encounter: Secondary | ICD-10-CM

## 2019-04-24 MED ORDER — EPINEPHRINE 0.3 MG/0.3ML IJ SOAJ: 0.3000 mg | Freq: Once | INTRAMUSCULAR | 2 refills | Status: DC

## 2019-04-24 NOTE — Progress Notes (Signed)
Follow Up Note  RE: LARRON ERNZEN MRN: [id number] DOB: 16-Apr-1951 Date of Office Visit: 04/24/2019  Referring provider: Prince Solian, MD Primary care provider: Prince Solian, MD  Chief Complaint: Follow-up  History of Present Illness: I had the pleasure of seeing [place] Meneley for a follow up visit at the [facility] [place] on 04/24/2019. He is a 68 y.o. male, who is being followed for hymenoptera allergy. Today he is here for regular follow up visit. His previous allergy office visit was on 06/22/2015 with Dr. Neldon Mc.   Currently on venom immunotherapy every 8 weeks and doing well.  No stings this year but usually get stung a few times per year. Usually does well with benadryl. No Epipen use in the past.   He had extremity angioedema, throat swelling in the past.   He had venom immunotherapy as a child as well. As an adult he had anaphylaxis but not as bad as a child.   Patient is on multiple medications for hypertension.   Assessment and Plan: Mouhamad is a 68 y.o. male with: Toxic effect of venom of bees, unintentional Past history - anaphylaxis as a child. Completed AIT but then had less severe reaction as an adult. Now on venom AIT Q8 weeks for many years (mixed vespids and wasp) - started in 1990. Tolerates injections with no issues. Usually gets localized reactions after stings treated with benadryl only. 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 Not on honeybee AIT anymore.  Interim history - No stings this year.  Discussed at length about retesting to see sensitivities versus continuing injections. Decided to postpone any repeat testing at this time as it wouldn't change the medical therapy.  Some patients do need to be on venom immunotherapy indefinitely. If patient interested in stopping, will get bloodwork and/or repeat skin testing at that time.   Continue venom injections and will increase time interval to  every 10 weeks.  If has worsening localized reactions or systemic reactions after a sting then will move back to every 8 weeks.   Patient is on beta blocker for his cardiac issues.    Return in about 1 year (around 04/23/2020).  Meds ordered this encounter  Medications  . EPINEPHrine (EPIPEN 2-PAK) 0.3 mg/0.3 mL IJ SOAJ injection    Sig: Inject 0.3 mLs (0.3 mg total) into the muscle once for 1 dose.    Dispense:  0.3 mL    Refill:  2   Diagnostics: Spirometry:  None.  Medication List:  Current Outpatient Medications  Medication Sig Dispense Refill  . Acetaminophen 325 MG CAPS Take 2 capsules by mouth as needed.    Marland Kitchen EPINEPHrine (EPIPEN 2-PAK) 0.3 mg/0.3 mL IJ SOAJ injection Inject 0.3 mLs (0.3 mg total) into the muscle once for 1 dose. 0.3 mL 2  . esomeprazole (NEXIUM) 40 MG capsule Take 40 mg by mouth daily before breakfast.      . eszopiclone (LUNESTA) 1 MG TABS tablet Take 1 mg by mouth at bedtime as needed for sleep. Take immediately before bedtime    . fenofibrate (TRICOR) 145 MG tablet Take 145 mg by mouth every morning.     . metoprolol succinate (TOPROL-XL) 50 MG 24 hr tablet Take 50 mg by mouth every morning. Take with or immediately following a meal.     . Multiple Vitamins-Minerals (CENTRUM SILVER 50+MEN PO) Take by mouth daily.    Marland Kitchen olmesartan (BENICAR) 5 MG tablet TAKE 2  TABLETS BY MOUTH EVERY DAY 60 tablet 1  . omega-3 acid ethyl esters (LOVAZA) 1 G capsule Take 1 g by mouth daily.     . simvastatin (ZOCOR) 40 MG tablet Take 40 mg by mouth every morning.     Alveda Reasons 20 MG TABS tablet daily.     No current facility-administered medications for this visit.    Allergies: Allergies  Allergen Reactions  . Aquasonic 100 [Ultrasound Gel]   . Clindamycin/Lincomycin Diarrhea  . Other     The gel used for ultrasound  Aqua sonic 100 - caused rash  The ekg electrodes cause skin irritation that last for weeks Allergic to bees and wasps  . Penicillins Hives  .  Polymyxin B Hives  . Venomil Mixed Vespid [Mixed Vespid Venom]   . Venomil Wasp Venom [Wasp Venom]    I reviewed his past medical history, social history, family history, and environmental history and no significant changes have been reported from previous visit on 06/22/2015.  Review of Systems  Constitutional: Negative for appetite change, chills, fever and unexpected weight change.  HENT: Negative for congestion and rhinorrhea.   Eyes: Negative for itching.  Respiratory: Negative for cough, chest tightness, shortness of breath and wheezing.   Gastrointestinal: Negative for abdominal pain.  Skin: Negative for rash.  Neurological: Negative for headaches.   Objective: BP 130/78 (BP Location: Right Arm, Patient Position: Sitting, Cuff Size: Normal)   Pulse 73   Temp (!) 96.9 F (36.1 C) (Temporal)   Resp 16   Ht 6\' 3"  (1.905 m)   Wt 238 lb 12 oz (108.3 kg)   SpO2 96%   BMI 29.84 kg/m  Body mass index is 29.84 kg/m. Physical Exam  Constitutional: He is oriented to person, place, and time. He appears well-developed and well-nourished.  HENT:  Head: Normocephalic and atraumatic.  Right Ear: External ear normal.  Left Ear: External ear normal.  Nose: Nose normal.  Mouth/Throat: Oropharynx is clear and moist.  Eyes: Conjunctivae and EOM are normal.  Neck: Neck supple.  Cardiovascular: Normal rate, regular rhythm and normal heart sounds. Exam reveals no gallop and no friction rub.  No murmur heard. Pulmonary/Chest: Effort normal and breath sounds normal. He has no wheezes. He has no rales.  Neurological: He is alert and oriented to person, place, and time.  Skin: Skin is warm. No rash noted.  Psychiatric: He has a normal mood and affect. His behavior is normal.  Nursing note and vitals reviewed.  Previous notes and tests were reviewed. The plan was reviewed with the patient/family, and all questions/concerned were addressed.  It was my pleasure to see Jess today and  participate in his care. Please feel free to contact me with any questions or concerns.  Sincerely,  Rexene Alberts, DO Allergy & Immunology  [facility] office: [phone] [facility] office: [place] office: [phone]

## 2019-04-24 NOTE — Patient Instructions (Addendum)
Will increase time between injections to every 10 weeks.   I have prescribed epinephrine injectable and demonstrated proper use. For mild symptoms you can take over the counter antihistamines such as Benadryl and monitor symptoms closely. If symptoms worsen or if you have severe symptoms including breathing issues, throat closure, significant swelling, whole body hives, severe diarrhea and vomiting, lightheadedness then inject epinephrine and seek immediate medical care afterwards.  Follow up in 1 year

## 2019-04-24 NOTE — Assessment & Plan Note (Addendum)
Past history - anaphylaxis as a child. Completed AIT but then had less severe reaction as an adult. Now on venom AIT Q8 weeks for many years (mixed vespids and wasp) - started in 1990. Tolerates injections with no issues. Usually gets localized reactions after stings treated with benadryl only. 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 Not on honeybee AIT anymore.  Interim history - No stings this year.  Discussed at length about retesting to see sensitivities versus continuing injections. Decided to postpone any repeat testing at this time as it wouldn't change the medical therapy.  Some patients do need to be on venom immunotherapy indefinitely. If patient interested in stopping, will get bloodwork and/or repeat skin testing at that time.   Continue venom injections and will increase time interval to every 10 weeks.  If has worsening localized reactions or systemic reactions after a sting then will move back to every 8 weeks.   Patient is on beta blocker for his cardiac issues.

## 2019-04-29 DIAGNOSIS — M542 Cervicalgia: Secondary | ICD-10-CM | POA: Diagnosis not present

## 2019-04-29 DIAGNOSIS — M5011 Cervical disc disorder with radiculopathy,  high cervical region: Secondary | ICD-10-CM | POA: Diagnosis not present

## 2019-04-29 DIAGNOSIS — M9901 Segmental and somatic dysfunction of cervical region: Secondary | ICD-10-CM | POA: Diagnosis not present

## 2019-05-03 DIAGNOSIS — Z23 Encounter for immunization: Secondary | ICD-10-CM | POA: Diagnosis not present

## 2019-05-11 ENCOUNTER — Other Ambulatory Visit: Payer: Self-pay | Admitting: Cardiology

## 2019-05-16 DIAGNOSIS — I129 Hypertensive chronic kidney disease with stage 1 through stage 4 chronic kidney disease, or unspecified chronic kidney disease: Secondary | ICD-10-CM | POA: Diagnosis not present

## 2019-05-16 DIAGNOSIS — C61 Malignant neoplasm of prostate: Secondary | ICD-10-CM | POA: Diagnosis not present

## 2019-05-16 DIAGNOSIS — N183 Chronic kidney disease, stage 3 unspecified: Secondary | ICD-10-CM | POA: Diagnosis not present

## 2019-05-16 DIAGNOSIS — I959 Hypotension, unspecified: Secondary | ICD-10-CM | POA: Diagnosis not present

## 2019-05-16 DIAGNOSIS — K219 Gastro-esophageal reflux disease without esophagitis: Secondary | ICD-10-CM | POA: Diagnosis not present

## 2019-05-16 DIAGNOSIS — I2699 Other pulmonary embolism without acute cor pulmonale: Secondary | ICD-10-CM | POA: Diagnosis not present

## 2019-05-16 DIAGNOSIS — M79672 Pain in left foot: Secondary | ICD-10-CM | POA: Diagnosis not present

## 2019-06-09 DIAGNOSIS — H43812 Vitreous degeneration, left eye: Secondary | ICD-10-CM | POA: Diagnosis not present

## 2019-06-17 ENCOUNTER — Ambulatory Visit: Payer: Self-pay

## 2019-06-19 ENCOUNTER — Ambulatory Visit (INDEPENDENT_AMBULATORY_CARE_PROVIDER_SITE_OTHER): Payer: Medicare Other

## 2019-06-19 ENCOUNTER — Other Ambulatory Visit: Payer: Self-pay

## 2019-06-19 DIAGNOSIS — T63441D Toxic effect of venom of bees, accidental (unintentional), subsequent encounter: Secondary | ICD-10-CM | POA: Diagnosis not present

## 2019-06-24 ENCOUNTER — Ambulatory Visit: Payer: Self-pay

## 2019-06-30 DIAGNOSIS — H43812 Vitreous degeneration, left eye: Secondary | ICD-10-CM | POA: Diagnosis not present

## 2019-08-20 DIAGNOSIS — Z125 Encounter for screening for malignant neoplasm of prostate: Secondary | ICD-10-CM | POA: Diagnosis not present

## 2019-08-20 DIAGNOSIS — E7849 Other hyperlipidemia: Secondary | ICD-10-CM | POA: Diagnosis not present

## 2019-08-21 DIAGNOSIS — I129 Hypertensive chronic kidney disease with stage 1 through stage 4 chronic kidney disease, or unspecified chronic kidney disease: Secondary | ICD-10-CM | POA: Diagnosis not present

## 2019-08-21 DIAGNOSIS — R82998 Other abnormal findings in urine: Secondary | ICD-10-CM | POA: Diagnosis not present

## 2019-08-26 ENCOUNTER — Ambulatory Visit (INDEPENDENT_AMBULATORY_CARE_PROVIDER_SITE_OTHER): Payer: Medicare Other

## 2019-08-26 ENCOUNTER — Other Ambulatory Visit: Payer: Self-pay

## 2019-08-26 DIAGNOSIS — T63441D Toxic effect of venom of bees, accidental (unintentional), subsequent encounter: Secondary | ICD-10-CM

## 2019-08-27 DIAGNOSIS — I129 Hypertensive chronic kidney disease with stage 1 through stage 4 chronic kidney disease, or unspecified chronic kidney disease: Secondary | ICD-10-CM | POA: Diagnosis not present

## 2019-08-27 DIAGNOSIS — J302 Other seasonal allergic rhinitis: Secondary | ICD-10-CM | POA: Diagnosis not present

## 2019-08-27 DIAGNOSIS — G4700 Insomnia, unspecified: Secondary | ICD-10-CM | POA: Diagnosis not present

## 2019-08-27 DIAGNOSIS — N1831 Chronic kidney disease, stage 3a: Secondary | ICD-10-CM | POA: Diagnosis not present

## 2019-08-27 DIAGNOSIS — Z0000 Encounter for general adult medical examination without abnormal findings: Secondary | ICD-10-CM | POA: Diagnosis not present

## 2019-08-27 DIAGNOSIS — M5030 Other cervical disc degeneration, unspecified cervical region: Secondary | ICD-10-CM | POA: Diagnosis not present

## 2019-08-27 DIAGNOSIS — Z1331 Encounter for screening for depression: Secondary | ICD-10-CM | POA: Diagnosis not present

## 2019-08-27 DIAGNOSIS — C61 Malignant neoplasm of prostate: Secondary | ICD-10-CM | POA: Diagnosis not present

## 2019-08-27 DIAGNOSIS — E785 Hyperlipidemia, unspecified: Secondary | ICD-10-CM | POA: Diagnosis not present

## 2019-08-27 DIAGNOSIS — K219 Gastro-esophageal reflux disease without esophagitis: Secondary | ICD-10-CM | POA: Diagnosis not present

## 2019-08-27 DIAGNOSIS — I2699 Other pulmonary embolism without acute cor pulmonale: Secondary | ICD-10-CM | POA: Diagnosis not present

## 2019-08-27 DIAGNOSIS — I959 Hypotension, unspecified: Secondary | ICD-10-CM | POA: Diagnosis not present

## 2019-08-28 ENCOUNTER — Ambulatory Visit: Payer: Medicare Other

## 2019-09-02 ENCOUNTER — Ambulatory Visit: Payer: BLUE CROSS/BLUE SHIELD

## 2019-09-05 DIAGNOSIS — Z1212 Encounter for screening for malignant neoplasm of rectum: Secondary | ICD-10-CM | POA: Diagnosis not present

## 2019-09-11 ENCOUNTER — Ambulatory Visit: Payer: Medicare Other | Attending: Internal Medicine

## 2019-09-11 DIAGNOSIS — Z23 Encounter for immunization: Secondary | ICD-10-CM

## 2019-09-11 NOTE — Progress Notes (Signed)
   Covid-19 Vaccination Clinic  Name:  [name]    MRN: [id number] DOB: Mar 02, 1951  09/11/2019  [name] was observed post Covid-19 immunization for 15 minutes without incidence. He was provided with Vaccine Information Sheet and instruction to access the V-Safe system.   [name] was instructed to call 911 with any severe reactions post vaccine: Marland Kitchen Difficulty breathing  . Swelling of your face and throat  . A fast heartbeat  . A bad rash all over your body  . Dizziness and weakness    Immunizations Administered    Name Date Dose VIS Date Route   Pfizer COVID-19 Vaccine 09/11/2019  3:27 PM 0.3 mL 07/18/2019 Intramuscular   Manufacturer: [place]   Lot: [id number]   [place]: [id number]

## 2019-09-13 ENCOUNTER — Ambulatory Visit: Payer: BLUE CROSS/BLUE SHIELD

## 2019-10-07 ENCOUNTER — Ambulatory Visit: Payer: Medicare Other | Attending: Internal Medicine

## 2019-10-07 DIAGNOSIS — Z23 Encounter for immunization: Secondary | ICD-10-CM | POA: Insufficient documentation

## 2019-10-07 NOTE — Progress Notes (Signed)
   Covid-19 Vaccination Clinic  Name:  EBRAHEEM HEMAN    MRN: [id number] DOB: 1950/11/25  10/07/2019  Mr. Noblett was observed post Covid-19 immunization for 15 minutes without incident. He was provided with Vaccine Information Sheet and instruction to access the V-Safe system.   Mr. Kinkade was instructed to call 911 with any severe reactions post vaccine: Marland Kitchen Difficulty breathing  . Swelling of face and throat  . A fast heartbeat  . A bad rash all over body  . Dizziness and weakness   Immunizations Administered    Name Date Dose VIS Date Route   Pfizer COVID-19 Vaccine 10/07/2019  8:37 AM 0.3 mL 07/18/2019 Intramuscular   Manufacturer: [place]   Lot: [id number]   [place]: [id number]

## 2019-11-04 ENCOUNTER — Ambulatory Visit (INDEPENDENT_AMBULATORY_CARE_PROVIDER_SITE_OTHER): Payer: Medicare Other

## 2019-11-04 ENCOUNTER — Other Ambulatory Visit: Payer: Self-pay

## 2019-11-04 DIAGNOSIS — T63441D Toxic effect of venom of bees, accidental (unintentional), subsequent encounter: Secondary | ICD-10-CM | POA: Diagnosis not present

## 2020-01-13 ENCOUNTER — Other Ambulatory Visit: Payer: Self-pay

## 2020-01-13 ENCOUNTER — Ambulatory Visit (INDEPENDENT_AMBULATORY_CARE_PROVIDER_SITE_OTHER): Payer: Medicare Other

## 2020-01-13 DIAGNOSIS — T63441D Toxic effect of venom of bees, accidental (unintentional), subsequent encounter: Secondary | ICD-10-CM | POA: Diagnosis not present

## 2020-02-04 DIAGNOSIS — C61 Malignant neoplasm of prostate: Secondary | ICD-10-CM | POA: Diagnosis not present

## 2020-02-16 DIAGNOSIS — K219 Gastro-esophageal reflux disease without esophagitis: Secondary | ICD-10-CM | POA: Diagnosis not present

## 2020-02-16 DIAGNOSIS — C61 Malignant neoplasm of prostate: Secondary | ICD-10-CM | POA: Diagnosis not present

## 2020-02-16 DIAGNOSIS — I959 Hypotension, unspecified: Secondary | ICD-10-CM | POA: Diagnosis not present

## 2020-02-16 DIAGNOSIS — I2699 Other pulmonary embolism without acute cor pulmonale: Secondary | ICD-10-CM | POA: Diagnosis not present

## 2020-02-16 DIAGNOSIS — I129 Hypertensive chronic kidney disease with stage 1 through stage 4 chronic kidney disease, or unspecified chronic kidney disease: Secondary | ICD-10-CM | POA: Diagnosis not present

## 2020-02-16 DIAGNOSIS — F439 Reaction to severe stress, unspecified: Secondary | ICD-10-CM | POA: Diagnosis not present

## 2020-02-16 DIAGNOSIS — N1831 Chronic kidney disease, stage 3a: Secondary | ICD-10-CM | POA: Diagnosis not present

## 2020-03-17 DIAGNOSIS — I129 Hypertensive chronic kidney disease with stage 1 through stage 4 chronic kidney disease, or unspecified chronic kidney disease: Secondary | ICD-10-CM | POA: Diagnosis not present

## 2020-03-17 DIAGNOSIS — I493 Ventricular premature depolarization: Secondary | ICD-10-CM | POA: Diagnosis not present

## 2020-03-17 DIAGNOSIS — K219 Gastro-esophageal reflux disease without esophagitis: Secondary | ICD-10-CM | POA: Diagnosis not present

## 2020-03-17 DIAGNOSIS — I959 Hypotension, unspecified: Secondary | ICD-10-CM | POA: Diagnosis not present

## 2020-03-17 DIAGNOSIS — N1831 Chronic kidney disease, stage 3a: Secondary | ICD-10-CM | POA: Diagnosis not present

## 2020-03-23 ENCOUNTER — Ambulatory Visit (INDEPENDENT_AMBULATORY_CARE_PROVIDER_SITE_OTHER): Payer: Medicare Other

## 2020-03-23 ENCOUNTER — Other Ambulatory Visit: Payer: Self-pay

## 2020-03-23 DIAGNOSIS — T63441D Toxic effect of venom of bees, accidental (unintentional), subsequent encounter: Secondary | ICD-10-CM | POA: Diagnosis not present

## 2020-04-16 DIAGNOSIS — H905 Unspecified sensorineural hearing loss: Secondary | ICD-10-CM | POA: Diagnosis not present

## 2020-04-16 DIAGNOSIS — H8102 Meniere's disease, left ear: Secondary | ICD-10-CM | POA: Diagnosis not present

## 2020-04-16 DIAGNOSIS — H903 Sensorineural hearing loss, bilateral: Secondary | ICD-10-CM | POA: Diagnosis not present

## 2020-04-16 DIAGNOSIS — H9122 Sudden idiopathic hearing loss, left ear: Secondary | ICD-10-CM | POA: Diagnosis not present

## 2020-05-13 DIAGNOSIS — Z23 Encounter for immunization: Secondary | ICD-10-CM | POA: Diagnosis not present

## 2020-06-01 ENCOUNTER — Ambulatory Visit (INDEPENDENT_AMBULATORY_CARE_PROVIDER_SITE_OTHER): Payer: Medicare Other

## 2020-06-01 ENCOUNTER — Other Ambulatory Visit: Payer: Self-pay

## 2020-06-01 DIAGNOSIS — T63441D Toxic effect of venom of bees, accidental (unintentional), subsequent encounter: Secondary | ICD-10-CM

## 2020-06-04 DIAGNOSIS — Z03818 Encounter for observation for suspected exposure to other biological agents ruled out: Secondary | ICD-10-CM | POA: Diagnosis not present

## 2020-06-04 DIAGNOSIS — Z20822 Contact with and (suspected) exposure to covid-19: Secondary | ICD-10-CM | POA: Diagnosis not present

## 2020-07-13 DIAGNOSIS — H3563 Retinal hemorrhage, bilateral: Secondary | ICD-10-CM | POA: Diagnosis not present

## 2020-07-13 DIAGNOSIS — H3561 Retinal hemorrhage, right eye: Secondary | ICD-10-CM | POA: Diagnosis not present

## 2020-07-13 DIAGNOSIS — H35033 Hypertensive retinopathy, bilateral: Secondary | ICD-10-CM | POA: Diagnosis not present

## 2020-07-13 DIAGNOSIS — H538 Other visual disturbances: Secondary | ICD-10-CM | POA: Diagnosis not present

## 2020-07-13 DIAGNOSIS — H43813 Vitreous degeneration, bilateral: Secondary | ICD-10-CM | POA: Diagnosis not present

## 2020-08-10 ENCOUNTER — Other Ambulatory Visit: Payer: Self-pay

## 2020-08-10 ENCOUNTER — Ambulatory Visit (INDEPENDENT_AMBULATORY_CARE_PROVIDER_SITE_OTHER): Payer: Medicare Other

## 2020-08-10 DIAGNOSIS — T63441D Toxic effect of venom of bees, accidental (unintentional), subsequent encounter: Secondary | ICD-10-CM | POA: Diagnosis not present

## 2020-08-16 DIAGNOSIS — Z125 Encounter for screening for malignant neoplasm of prostate: Secondary | ICD-10-CM | POA: Diagnosis not present

## 2020-08-16 DIAGNOSIS — E785 Hyperlipidemia, unspecified: Secondary | ICD-10-CM | POA: Diagnosis not present

## 2020-08-27 DIAGNOSIS — K219 Gastro-esophageal reflux disease without esophagitis: Secondary | ICD-10-CM | POA: Diagnosis not present

## 2020-08-27 DIAGNOSIS — Z1212 Encounter for screening for malignant neoplasm of rectum: Secondary | ICD-10-CM | POA: Diagnosis not present

## 2020-08-27 DIAGNOSIS — I2699 Other pulmonary embolism without acute cor pulmonale: Secondary | ICD-10-CM | POA: Diagnosis not present

## 2020-08-27 DIAGNOSIS — R82998 Other abnormal findings in urine: Secondary | ICD-10-CM | POA: Diagnosis not present

## 2020-08-27 DIAGNOSIS — F439 Reaction to severe stress, unspecified: Secondary | ICD-10-CM | POA: Diagnosis not present

## 2020-08-27 DIAGNOSIS — G4700 Insomnia, unspecified: Secondary | ICD-10-CM | POA: Diagnosis not present

## 2020-08-27 DIAGNOSIS — C61 Malignant neoplasm of prostate: Secondary | ICD-10-CM | POA: Diagnosis not present

## 2020-08-27 DIAGNOSIS — I493 Ventricular premature depolarization: Secondary | ICD-10-CM | POA: Diagnosis not present

## 2020-08-27 DIAGNOSIS — N1831 Chronic kidney disease, stage 3a: Secondary | ICD-10-CM | POA: Diagnosis not present

## 2020-08-27 DIAGNOSIS — Z0000 Encounter for general adult medical examination without abnormal findings: Secondary | ICD-10-CM | POA: Diagnosis not present

## 2020-08-27 DIAGNOSIS — I959 Hypotension, unspecified: Secondary | ICD-10-CM | POA: Diagnosis not present

## 2020-08-27 DIAGNOSIS — I129 Hypertensive chronic kidney disease with stage 1 through stage 4 chronic kidney disease, or unspecified chronic kidney disease: Secondary | ICD-10-CM | POA: Diagnosis not present

## 2020-08-27 DIAGNOSIS — E785 Hyperlipidemia, unspecified: Secondary | ICD-10-CM | POA: Diagnosis not present

## 2020-08-27 DIAGNOSIS — J302 Other seasonal allergic rhinitis: Secondary | ICD-10-CM | POA: Diagnosis not present

## 2020-09-21 DIAGNOSIS — H3563 Retinal hemorrhage, bilateral: Secondary | ICD-10-CM | POA: Diagnosis not present

## 2020-09-21 DIAGNOSIS — H43813 Vitreous degeneration, bilateral: Secondary | ICD-10-CM | POA: Diagnosis not present

## 2020-09-21 DIAGNOSIS — H35033 Hypertensive retinopathy, bilateral: Secondary | ICD-10-CM | POA: Diagnosis not present

## 2020-10-19 ENCOUNTER — Other Ambulatory Visit: Payer: Self-pay

## 2020-10-19 ENCOUNTER — Ambulatory Visit (INDEPENDENT_AMBULATORY_CARE_PROVIDER_SITE_OTHER): Payer: Medicare Other

## 2020-10-19 DIAGNOSIS — T63441D Toxic effect of venom of bees, accidental (unintentional), subsequent encounter: Secondary | ICD-10-CM | POA: Diagnosis not present

## 2020-12-21 DIAGNOSIS — M25461 Effusion, right knee: Secondary | ICD-10-CM | POA: Diagnosis not present

## 2020-12-21 DIAGNOSIS — M25561 Pain in right knee: Secondary | ICD-10-CM | POA: Diagnosis not present

## 2020-12-24 DIAGNOSIS — M25561 Pain in right knee: Secondary | ICD-10-CM | POA: Diagnosis not present

## 2020-12-27 DIAGNOSIS — M2241 Chondromalacia patellae, right knee: Secondary | ICD-10-CM | POA: Diagnosis not present

## 2020-12-27 DIAGNOSIS — S83241A Other tear of medial meniscus, current injury, right knee, initial encounter: Secondary | ICD-10-CM | POA: Diagnosis not present

## 2020-12-28 ENCOUNTER — Ambulatory Visit (INDEPENDENT_AMBULATORY_CARE_PROVIDER_SITE_OTHER): Payer: Medicare Other

## 2020-12-28 ENCOUNTER — Other Ambulatory Visit: Payer: Self-pay

## 2020-12-28 DIAGNOSIS — T63441D Toxic effect of venom of bees, accidental (unintentional), subsequent encounter: Secondary | ICD-10-CM | POA: Diagnosis not present

## 2021-01-17 DIAGNOSIS — Z23 Encounter for immunization: Secondary | ICD-10-CM | POA: Diagnosis not present

## 2021-01-27 DIAGNOSIS — M2241 Chondromalacia patellae, right knee: Secondary | ICD-10-CM | POA: Diagnosis not present

## 2021-01-27 DIAGNOSIS — S83241A Other tear of medial meniscus, current injury, right knee, initial encounter: Secondary | ICD-10-CM | POA: Diagnosis not present

## 2021-02-08 DIAGNOSIS — C61 Malignant neoplasm of prostate: Secondary | ICD-10-CM | POA: Diagnosis not present

## 2021-02-15 DIAGNOSIS — C61 Malignant neoplasm of prostate: Secondary | ICD-10-CM | POA: Diagnosis not present

## 2021-02-15 DIAGNOSIS — N5201 Erectile dysfunction due to arterial insufficiency: Secondary | ICD-10-CM | POA: Diagnosis not present

## 2021-02-15 DIAGNOSIS — N3041 Irradiation cystitis with hematuria: Secondary | ICD-10-CM | POA: Diagnosis not present

## 2021-02-23 DIAGNOSIS — Z20822 Contact with and (suspected) exposure to covid-19: Secondary | ICD-10-CM | POA: Diagnosis not present

## 2021-03-08 ENCOUNTER — Ambulatory Visit: Payer: Self-pay

## 2021-03-10 ENCOUNTER — Ambulatory Visit (INDEPENDENT_AMBULATORY_CARE_PROVIDER_SITE_OTHER): Payer: Medicare Other | Admitting: *Deleted

## 2021-03-10 ENCOUNTER — Other Ambulatory Visit: Payer: Self-pay

## 2021-03-10 DIAGNOSIS — T63441D Toxic effect of venom of bees, accidental (unintentional), subsequent encounter: Secondary | ICD-10-CM | POA: Diagnosis not present

## 2021-03-15 DIAGNOSIS — I959 Hypotension, unspecified: Secondary | ICD-10-CM | POA: Diagnosis not present

## 2021-03-15 DIAGNOSIS — F439 Reaction to severe stress, unspecified: Secondary | ICD-10-CM | POA: Diagnosis not present

## 2021-03-15 DIAGNOSIS — E785 Hyperlipidemia, unspecified: Secondary | ICD-10-CM | POA: Diagnosis not present

## 2021-03-15 DIAGNOSIS — N1831 Chronic kidney disease, stage 3a: Secondary | ICD-10-CM | POA: Diagnosis not present

## 2021-03-15 DIAGNOSIS — U071 COVID-19: Secondary | ICD-10-CM | POA: Diagnosis not present

## 2021-03-15 DIAGNOSIS — J302 Other seasonal allergic rhinitis: Secondary | ICD-10-CM | POA: Diagnosis not present

## 2021-03-15 DIAGNOSIS — I129 Hypertensive chronic kidney disease with stage 1 through stage 4 chronic kidney disease, or unspecified chronic kidney disease: Secondary | ICD-10-CM | POA: Diagnosis not present

## 2021-03-15 DIAGNOSIS — K219 Gastro-esophageal reflux disease without esophagitis: Secondary | ICD-10-CM | POA: Diagnosis not present

## 2021-03-15 DIAGNOSIS — G4700 Insomnia, unspecified: Secondary | ICD-10-CM | POA: Diagnosis not present

## 2021-03-15 DIAGNOSIS — I2699 Other pulmonary embolism without acute cor pulmonale: Secondary | ICD-10-CM | POA: Diagnosis not present

## 2021-03-15 DIAGNOSIS — I493 Ventricular premature depolarization: Secondary | ICD-10-CM | POA: Diagnosis not present

## 2021-03-22 DIAGNOSIS — H35033 Hypertensive retinopathy, bilateral: Secondary | ICD-10-CM | POA: Diagnosis not present

## 2021-03-22 DIAGNOSIS — H43813 Vitreous degeneration, bilateral: Secondary | ICD-10-CM | POA: Diagnosis not present

## 2021-03-22 DIAGNOSIS — H35373 Puckering of macula, bilateral: Secondary | ICD-10-CM | POA: Diagnosis not present

## 2021-03-22 DIAGNOSIS — H3563 Retinal hemorrhage, bilateral: Secondary | ICD-10-CM | POA: Diagnosis not present

## 2021-05-09 DIAGNOSIS — Z1211 Encounter for screening for malignant neoplasm of colon: Secondary | ICD-10-CM | POA: Diagnosis not present

## 2021-05-09 DIAGNOSIS — D122 Benign neoplasm of ascending colon: Secondary | ICD-10-CM | POA: Diagnosis not present

## 2021-05-09 DIAGNOSIS — K649 Unspecified hemorrhoids: Secondary | ICD-10-CM | POA: Diagnosis not present

## 2021-05-09 DIAGNOSIS — K5730 Diverticulosis of large intestine without perforation or abscess without bleeding: Secondary | ICD-10-CM | POA: Diagnosis not present

## 2021-05-11 DIAGNOSIS — D122 Benign neoplasm of ascending colon: Secondary | ICD-10-CM | POA: Diagnosis not present

## 2021-05-12 ENCOUNTER — Ambulatory Visit (INDEPENDENT_AMBULATORY_CARE_PROVIDER_SITE_OTHER): Payer: Medicare Other

## 2021-05-12 ENCOUNTER — Other Ambulatory Visit: Payer: Self-pay

## 2021-05-12 DIAGNOSIS — T63441D Toxic effect of venom of bees, accidental (unintentional), subsequent encounter: Secondary | ICD-10-CM | POA: Diagnosis not present

## 2021-05-13 DIAGNOSIS — H8102 Meniere's disease, left ear: Secondary | ICD-10-CM | POA: Diagnosis not present

## 2021-05-13 DIAGNOSIS — Z974 Presence of external hearing-aid: Secondary | ICD-10-CM | POA: Diagnosis not present

## 2021-05-13 DIAGNOSIS — Z01118 Encounter for examination of ears and hearing with other abnormal findings: Secondary | ICD-10-CM | POA: Diagnosis not present

## 2021-05-13 DIAGNOSIS — H9312 Tinnitus, left ear: Secondary | ICD-10-CM | POA: Diagnosis not present

## 2021-05-13 DIAGNOSIS — H903 Sensorineural hearing loss, bilateral: Secondary | ICD-10-CM | POA: Diagnosis not present

## 2021-05-18 DIAGNOSIS — Z23 Encounter for immunization: Secondary | ICD-10-CM | POA: Diagnosis not present

## 2021-05-19 ENCOUNTER — Ambulatory Visit: Payer: Self-pay

## 2021-06-23 DIAGNOSIS — M25561 Pain in right knee: Secondary | ICD-10-CM | POA: Diagnosis not present

## 2021-06-23 DIAGNOSIS — S83241D Other tear of medial meniscus, current injury, right knee, subsequent encounter: Secondary | ICD-10-CM | POA: Diagnosis not present

## 2021-07-21 ENCOUNTER — Ambulatory Visit (INDEPENDENT_AMBULATORY_CARE_PROVIDER_SITE_OTHER): Payer: Medicare Other

## 2021-07-21 ENCOUNTER — Other Ambulatory Visit: Payer: Self-pay

## 2021-07-21 DIAGNOSIS — T63441D Toxic effect of venom of bees, accidental (unintentional), subsequent encounter: Secondary | ICD-10-CM

## 2021-08-16 DIAGNOSIS — H35033 Hypertensive retinopathy, bilateral: Secondary | ICD-10-CM | POA: Diagnosis not present

## 2021-08-16 DIAGNOSIS — H43813 Vitreous degeneration, bilateral: Secondary | ICD-10-CM | POA: Diagnosis not present

## 2021-08-16 DIAGNOSIS — H35373 Puckering of macula, bilateral: Secondary | ICD-10-CM | POA: Diagnosis not present

## 2021-08-16 DIAGNOSIS — H2513 Age-related nuclear cataract, bilateral: Secondary | ICD-10-CM | POA: Diagnosis not present

## 2021-09-13 DIAGNOSIS — Z125 Encounter for screening for malignant neoplasm of prostate: Secondary | ICD-10-CM | POA: Diagnosis not present

## 2021-09-13 DIAGNOSIS — I959 Hypotension, unspecified: Secondary | ICD-10-CM | POA: Diagnosis not present

## 2021-09-13 DIAGNOSIS — N1831 Chronic kidney disease, stage 3a: Secondary | ICD-10-CM | POA: Diagnosis not present

## 2021-09-13 DIAGNOSIS — E785 Hyperlipidemia, unspecified: Secondary | ICD-10-CM | POA: Diagnosis not present

## 2021-09-20 DIAGNOSIS — E785 Hyperlipidemia, unspecified: Secondary | ICD-10-CM | POA: Diagnosis not present

## 2021-09-20 DIAGNOSIS — Z1212 Encounter for screening for malignant neoplasm of rectum: Secondary | ICD-10-CM | POA: Diagnosis not present

## 2021-09-20 DIAGNOSIS — Z0000 Encounter for general adult medical examination without abnormal findings: Secondary | ICD-10-CM | POA: Diagnosis not present

## 2021-09-20 DIAGNOSIS — R82998 Other abnormal findings in urine: Secondary | ICD-10-CM | POA: Diagnosis not present

## 2021-09-20 DIAGNOSIS — I129 Hypertensive chronic kidney disease with stage 1 through stage 4 chronic kidney disease, or unspecified chronic kidney disease: Secondary | ICD-10-CM | POA: Diagnosis not present

## 2021-09-20 DIAGNOSIS — N1831 Chronic kidney disease, stage 3a: Secondary | ICD-10-CM | POA: Diagnosis not present

## 2021-09-29 ENCOUNTER — Other Ambulatory Visit: Payer: Self-pay

## 2021-09-29 ENCOUNTER — Ambulatory Visit (INDEPENDENT_AMBULATORY_CARE_PROVIDER_SITE_OTHER): Payer: Medicare Other

## 2021-09-29 DIAGNOSIS — T63441D Toxic effect of venom of bees, accidental (unintentional), subsequent encounter: Secondary | ICD-10-CM | POA: Diagnosis not present

## 2021-12-08 ENCOUNTER — Ambulatory Visit (INDEPENDENT_AMBULATORY_CARE_PROVIDER_SITE_OTHER): Payer: Medicare Other

## 2021-12-08 DIAGNOSIS — T63441D Toxic effect of venom of bees, accidental (unintentional), subsequent encounter: Secondary | ICD-10-CM

## 2022-02-08 DIAGNOSIS — C61 Malignant neoplasm of prostate: Secondary | ICD-10-CM | POA: Diagnosis not present

## 2022-02-14 ENCOUNTER — Ambulatory Visit: Payer: Medicare Other

## 2022-02-14 DIAGNOSIS — T63441D Toxic effect of venom of bees, accidental (unintentional), subsequent encounter: Secondary | ICD-10-CM | POA: Diagnosis not present

## 2022-02-15 DIAGNOSIS — N3041 Irradiation cystitis with hematuria: Secondary | ICD-10-CM | POA: Diagnosis not present

## 2022-02-15 DIAGNOSIS — C61 Malignant neoplasm of prostate: Secondary | ICD-10-CM | POA: Diagnosis not present

## 2022-02-16 ENCOUNTER — Ambulatory Visit: Payer: Medicare Other

## 2022-03-01 DIAGNOSIS — I10 Essential (primary) hypertension: Secondary | ICD-10-CM | POA: Diagnosis not present

## 2022-03-01 DIAGNOSIS — H35373 Puckering of macula, bilateral: Secondary | ICD-10-CM | POA: Diagnosis not present

## 2022-03-01 DIAGNOSIS — H43813 Vitreous degeneration, bilateral: Secondary | ICD-10-CM | POA: Diagnosis not present

## 2022-03-01 DIAGNOSIS — H35033 Hypertensive retinopathy, bilateral: Secondary | ICD-10-CM | POA: Diagnosis not present

## 2022-03-13 DIAGNOSIS — E785 Hyperlipidemia, unspecified: Secondary | ICD-10-CM | POA: Diagnosis not present

## 2022-03-13 DIAGNOSIS — N1831 Chronic kidney disease, stage 3a: Secondary | ICD-10-CM | POA: Diagnosis not present

## 2022-03-13 DIAGNOSIS — I129 Hypertensive chronic kidney disease with stage 1 through stage 4 chronic kidney disease, or unspecified chronic kidney disease: Secondary | ICD-10-CM | POA: Diagnosis not present

## 2022-03-13 DIAGNOSIS — I2699 Other pulmonary embolism without acute cor pulmonale: Secondary | ICD-10-CM | POA: Diagnosis not present

## 2022-04-18 ENCOUNTER — Ambulatory Visit (INDEPENDENT_AMBULATORY_CARE_PROVIDER_SITE_OTHER): Payer: Medicare Other

## 2022-04-18 DIAGNOSIS — T63441D Toxic effect of venom of bees, accidental (unintentional), subsequent encounter: Secondary | ICD-10-CM

## 2022-04-25 ENCOUNTER — Ambulatory Visit: Payer: Medicare Other

## 2022-05-05 DIAGNOSIS — H5203 Hypermetropia, bilateral: Secondary | ICD-10-CM | POA: Diagnosis not present

## 2022-05-18 DIAGNOSIS — Z880 Allergy status to penicillin: Secondary | ICD-10-CM | POA: Diagnosis not present

## 2022-05-18 DIAGNOSIS — H9122 Sudden idiopathic hearing loss, left ear: Secondary | ICD-10-CM | POA: Diagnosis not present

## 2022-05-18 DIAGNOSIS — H903 Sensorineural hearing loss, bilateral: Secondary | ICD-10-CM | POA: Diagnosis not present

## 2022-05-18 DIAGNOSIS — Z461 Encounter for fitting and adjustment of hearing aid: Secondary | ICD-10-CM | POA: Diagnosis not present

## 2022-05-18 DIAGNOSIS — H8102 Meniere's disease, left ear: Secondary | ICD-10-CM | POA: Diagnosis not present

## 2022-06-27 ENCOUNTER — Ambulatory Visit (INDEPENDENT_AMBULATORY_CARE_PROVIDER_SITE_OTHER): Payer: Medicare Other

## 2022-06-27 DIAGNOSIS — T63441D Toxic effect of venom of bees, accidental (unintentional), subsequent encounter: Secondary | ICD-10-CM | POA: Diagnosis not present

## 2022-07-17 DIAGNOSIS — M25561 Pain in right knee: Secondary | ICD-10-CM | POA: Diagnosis not present

## 2022-09-04 DIAGNOSIS — I10 Essential (primary) hypertension: Secondary | ICD-10-CM | POA: Diagnosis not present

## 2022-09-04 DIAGNOSIS — H35373 Puckering of macula, bilateral: Secondary | ICD-10-CM | POA: Diagnosis not present

## 2022-09-04 DIAGNOSIS — H2513 Age-related nuclear cataract, bilateral: Secondary | ICD-10-CM | POA: Diagnosis not present

## 2022-09-04 DIAGNOSIS — H35033 Hypertensive retinopathy, bilateral: Secondary | ICD-10-CM | POA: Diagnosis not present

## 2022-09-05 ENCOUNTER — Ambulatory Visit (INDEPENDENT_AMBULATORY_CARE_PROVIDER_SITE_OTHER): Payer: Medicare Other

## 2022-09-05 DIAGNOSIS — T63441D Toxic effect of venom of bees, accidental (unintentional), subsequent encounter: Secondary | ICD-10-CM | POA: Diagnosis not present

## 2022-09-15 DIAGNOSIS — H2511 Age-related nuclear cataract, right eye: Secondary | ICD-10-CM | POA: Diagnosis not present

## 2022-09-15 DIAGNOSIS — H2513 Age-related nuclear cataract, bilateral: Secondary | ICD-10-CM | POA: Diagnosis not present

## 2022-09-25 DIAGNOSIS — R7989 Other specified abnormal findings of blood chemistry: Secondary | ICD-10-CM | POA: Diagnosis not present

## 2022-09-25 DIAGNOSIS — Z79899 Other long term (current) drug therapy: Secondary | ICD-10-CM | POA: Diagnosis not present

## 2022-09-25 DIAGNOSIS — K219 Gastro-esophageal reflux disease without esophagitis: Secondary | ICD-10-CM | POA: Diagnosis not present

## 2022-09-25 DIAGNOSIS — Z125 Encounter for screening for malignant neoplasm of prostate: Secondary | ICD-10-CM | POA: Diagnosis not present

## 2022-09-25 DIAGNOSIS — E785 Hyperlipidemia, unspecified: Secondary | ICD-10-CM | POA: Diagnosis not present

## 2022-09-26 DIAGNOSIS — Z1212 Encounter for screening for malignant neoplasm of rectum: Secondary | ICD-10-CM | POA: Diagnosis not present

## 2022-10-02 DIAGNOSIS — C61 Malignant neoplasm of prostate: Secondary | ICD-10-CM | POA: Diagnosis not present

## 2022-10-02 DIAGNOSIS — Z0000 Encounter for general adult medical examination without abnormal findings: Secondary | ICD-10-CM | POA: Diagnosis not present

## 2022-10-02 DIAGNOSIS — I2699 Other pulmonary embolism without acute cor pulmonale: Secondary | ICD-10-CM | POA: Diagnosis not present

## 2022-10-02 DIAGNOSIS — N1831 Chronic kidney disease, stage 3a: Secondary | ICD-10-CM | POA: Diagnosis not present

## 2022-10-02 DIAGNOSIS — I129 Hypertensive chronic kidney disease with stage 1 through stage 4 chronic kidney disease, or unspecified chronic kidney disease: Secondary | ICD-10-CM | POA: Diagnosis not present

## 2022-11-14 ENCOUNTER — Ambulatory Visit (INDEPENDENT_AMBULATORY_CARE_PROVIDER_SITE_OTHER): Payer: Medicare Other

## 2022-11-14 DIAGNOSIS — T63441D Toxic effect of venom of bees, accidental (unintentional), subsequent encounter: Secondary | ICD-10-CM | POA: Diagnosis not present

## 2022-11-24 DIAGNOSIS — H2513 Age-related nuclear cataract, bilateral: Secondary | ICD-10-CM | POA: Diagnosis not present

## 2022-11-24 DIAGNOSIS — I10 Essential (primary) hypertension: Secondary | ICD-10-CM | POA: Diagnosis not present

## 2022-12-21 DIAGNOSIS — H2513 Age-related nuclear cataract, bilateral: Secondary | ICD-10-CM | POA: Diagnosis not present

## 2022-12-28 DIAGNOSIS — H2511 Age-related nuclear cataract, right eye: Secondary | ICD-10-CM | POA: Diagnosis not present

## 2022-12-29 DIAGNOSIS — H2512 Age-related nuclear cataract, left eye: Secondary | ICD-10-CM | POA: Diagnosis not present

## 2022-12-29 DIAGNOSIS — H25012 Cortical age-related cataract, left eye: Secondary | ICD-10-CM | POA: Diagnosis not present

## 2022-12-29 DIAGNOSIS — H25042 Posterior subcapsular polar age-related cataract, left eye: Secondary | ICD-10-CM | POA: Diagnosis not present

## 2023-01-02 DIAGNOSIS — N1831 Chronic kidney disease, stage 3a: Secondary | ICD-10-CM | POA: Diagnosis not present

## 2023-01-02 DIAGNOSIS — I129 Hypertensive chronic kidney disease with stage 1 through stage 4 chronic kidney disease, or unspecified chronic kidney disease: Secondary | ICD-10-CM | POA: Diagnosis not present

## 2023-01-11 DIAGNOSIS — H2512 Age-related nuclear cataract, left eye: Secondary | ICD-10-CM | POA: Diagnosis not present

## 2023-01-23 ENCOUNTER — Ambulatory Visit (INDEPENDENT_AMBULATORY_CARE_PROVIDER_SITE_OTHER): Payer: Medicare Other

## 2023-01-23 DIAGNOSIS — T63441D Toxic effect of venom of bees, accidental (unintentional), subsequent encounter: Secondary | ICD-10-CM | POA: Diagnosis not present

## 2023-01-31 DIAGNOSIS — X32XXXA Exposure to sunlight, initial encounter: Secondary | ICD-10-CM | POA: Diagnosis not present

## 2023-01-31 DIAGNOSIS — D225 Melanocytic nevi of trunk: Secondary | ICD-10-CM | POA: Diagnosis not present

## 2023-01-31 DIAGNOSIS — L570 Actinic keratosis: Secondary | ICD-10-CM | POA: Diagnosis not present

## 2023-01-31 DIAGNOSIS — Z1283 Encounter for screening for malignant neoplasm of skin: Secondary | ICD-10-CM | POA: Diagnosis not present

## 2023-02-01 DIAGNOSIS — H52223 Regular astigmatism, bilateral: Secondary | ICD-10-CM | POA: Diagnosis not present

## 2023-02-07 DIAGNOSIS — C61 Malignant neoplasm of prostate: Secondary | ICD-10-CM | POA: Diagnosis not present

## 2023-02-12 DIAGNOSIS — M25561 Pain in right knee: Secondary | ICD-10-CM | POA: Diagnosis not present

## 2023-02-14 DIAGNOSIS — N3041 Irradiation cystitis with hematuria: Secondary | ICD-10-CM | POA: Diagnosis not present

## 2023-02-14 DIAGNOSIS — C61 Malignant neoplasm of prostate: Secondary | ICD-10-CM | POA: Diagnosis not present

## 2023-02-21 DIAGNOSIS — M25561 Pain in right knee: Secondary | ICD-10-CM | POA: Diagnosis not present

## 2023-02-26 DIAGNOSIS — S83241A Other tear of medial meniscus, current injury, right knee, initial encounter: Secondary | ICD-10-CM | POA: Diagnosis not present

## 2023-02-26 DIAGNOSIS — M1711 Unilateral primary osteoarthritis, right knee: Secondary | ICD-10-CM | POA: Diagnosis not present

## 2023-03-14 DIAGNOSIS — I2699 Other pulmonary embolism without acute cor pulmonale: Secondary | ICD-10-CM | POA: Diagnosis not present

## 2023-03-14 DIAGNOSIS — I129 Hypertensive chronic kidney disease with stage 1 through stage 4 chronic kidney disease, or unspecified chronic kidney disease: Secondary | ICD-10-CM | POA: Diagnosis not present

## 2023-03-14 DIAGNOSIS — E785 Hyperlipidemia, unspecified: Secondary | ICD-10-CM | POA: Diagnosis not present

## 2023-03-29 DIAGNOSIS — H04129 Dry eye syndrome of unspecified lacrimal gland: Secondary | ICD-10-CM | POA: Diagnosis not present

## 2023-04-03 ENCOUNTER — Telehealth: Payer: Self-pay

## 2023-04-03 ENCOUNTER — Ambulatory Visit: Payer: Medicare Other

## 2023-04-03 DIAGNOSIS — T63441D Toxic effect of venom of bees, accidental (unintentional), subsequent encounter: Secondary | ICD-10-CM | POA: Diagnosis not present

## 2023-04-03 MED ORDER — EPINEPHRINE 0.3 MG/0.3ML IJ SOAJ: 0.3000 mg | Freq: Once | INTRAMUSCULAR | 0 refills | Status: DC

## 2023-04-03 NOTE — Telephone Encounter (Signed)
Patient needs refill on Epi for venom, patient made appointment to see Dr.Kim

## 2023-04-09 NOTE — Progress Notes (Unsigned)
Follow Up Note  RE: [name] MRN: [id number] DOB: May 17, 1951 Date of Office Visit: 04/10/2023  Referring provider: Chilton Greathouse, MD Primary care provider: Chilton Greathouse, MD  Chief Complaint: No chief complaint on file.  History of Present Illness: I had the pleasure of seeing [name] for a follow up visit at the [facility] on 04/09/2023. He is a 72 y.o. 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 His previous allergy office visit was on 04/24/2019 with Dr. Selena Batten. Today is a regular follow up visit.  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 Completed AIT but then had less severe reaction as an adult. Now on venom AIT Q8 weeks for many years (mixed vespids and wasp) - started in 1990. Tolerates injections with no issues. Usually gets localized reactions after stings treated with benadryl only. 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 Not on honeybee AIT anymore.  Interim history - No stings this year. Discussed at length about retesting to see sensitivities versus continuing injections. Decided to postpone any repeat testing at this time as it wouldn't change the medical therapy. Some patients do need to be on venom immunotherapy indefinitely. If patient interested in stopping, will get bloodwork and/or repeat skin testing at that time.  Continue venom injections and will increase time interval to every 10 weeks. If has worsening localized reactions or systemic reactions after a sting then will move back to every 8 weeks.   Assessment and Plan: Tamar is a 72 y.o. male with: ***  No follow-ups on file.  No orders of the defined types were placed in this encounter.  Lab Orders  No laboratory test(s) ordered today    Diagnostics: Spirometry:  Tracings reviewed. His effort: {Blank single:19197::"Good reproducible efforts.","It was hard to get  consistent efforts and there is a question as to whether this reflects a maximal maneuver.","Poor effort, data can not be interpreted."} FVC: ***L FEV1: ***L, ***% predicted FEV1/FVC ratio: ***% Interpretation: {Blank single:19197::"Spirometry consistent with mild obstructive disease","Spirometry consistent with moderate obstructive disease","Spirometry consistent with severe obstructive disease","Spirometry consistent with possible restrictive disease","Spirometry consistent with mixed obstructive and restrictive disease","Spirometry uninterpretable due to technique","Spirometry consistent with normal pattern","No overt abnormalities noted given today's efforts"}.  Please see scanned spirometry results for details.  Skin Testing: {Blank single:19197::"Select foods","Environmental allergy panel","Environmental allergy panel and select foods","Food allergy panel","None","Deferred due to recent antihistamines use"}. *** Results discussed with patient/family.   Medication List:  Current Outpatient Medications  Medication Sig Dispense Refill   Acetaminophen 325 MG CAPS Take 2 capsules by mouth as needed.     EPINEPHrine (AUVI-Q) 0.3 mg/0.3 mL IJ SOAJ injection Inject 0.3 mg into the muscle once for 1 dose. As directed for life-threatening allergic reactions 0.3 mL 0   EPINEPHrine (EPIPEN 2-PAK) 0.3 mg/0.3 mL IJ SOAJ injection Inject 0.3 mLs (0.3 mg total) into the muscle once for 1 dose. 0.3 mL 2   esomeprazole (NEXIUM) 40 MG capsule Take 40 mg by mouth daily before breakfast.       eszopiclone (LUNESTA) 1 MG TABS tablet Take 1 mg by mouth at bedtime as needed for sleep. Take immediately before bedtime     fenofibrate (TRICOR) 145 MG tablet Take 145 mg by mouth every morning.      metoprolol succinate (TOPROL-XL) 50 MG 24 hr tablet Take 50 mg by mouth every morning. Take with or immediately following a  meal.      Multiple Vitamins-Minerals (CENTRUM SILVER 50+MEN PO) Take by mouth daily.      olmesartan (BENICAR) 5 MG tablet TAKE 2 TABLETS BY MOUTH EVERY DAY 180 tablet 1   omega-3 acid ethyl esters (LOVAZA) 1 G capsule Take 1 g by mouth daily.      simvastatin (ZOCOR) 40 MG tablet Take 40 mg by mouth every morning.      XARELTO 20 MG TABS tablet daily.     No current facility-administered medications for this visit.   Allergies: Allergies  Allergen Reactions   Aquasonic 100 [Ultrasound Gel]    Clindamycin/Lincomycin Diarrhea   Other     The gel used for ultrasound  Aqua sonic 100 - caused rash  The ekg electrodes cause skin irritation that last for weeks Allergic to bees and wasps   Penicillins Hives   Polymyxin B Hives   Venomil Mixed Vespid [Mixed Vespid Venom]    Venomil Wasp Venom [Wasp Venom]    I reviewed his past medical history, social history, family history, and environmental history and no significant changes have been reported from his previous visit.  Review of Systems  Constitutional:  Negative for appetite change, chills, fever and unexpected weight change.  HENT:  Negative for congestion and rhinorrhea.   Eyes:  Negative for itching.  Respiratory:  Negative for cough, chest tightness, shortness of breath and wheezing.   Cardiovascular:  Negative for chest pain.  Gastrointestinal:  Negative for abdominal pain.  Genitourinary:  Negative for difficulty urinating.  Skin:  Negative for rash.  Neurological:  Negative for headaches.    Objective: There were no vitals taken for this visit. There is no height or weight on file to calculate BMI. Physical Exam Vitals and nursing note reviewed.  Constitutional:      Appearance: Normal appearance. He is well-developed.  HENT:     Head: Normocephalic and atraumatic.     Right Ear: Tympanic membrane and external ear normal.     Left Ear: Tympanic membrane and external ear normal.     Nose: Nose normal.     Mouth/Throat:     Mouth: Mucous membranes are moist.     Pharynx: Oropharynx is clear.  Eyes:      Conjunctiva/sclera: Conjunctivae normal.  Cardiovascular:     Rate and Rhythm: Normal rate and regular rhythm.     Heart sounds: Normal heart sounds. No murmur heard.    No friction rub. No gallop.  Pulmonary:     Effort: Pulmonary effort is normal.     Breath sounds: Normal breath sounds. No wheezing, rhonchi or rales.  Musculoskeletal:     Cervical back: Neck supple.  Skin:    General: Skin is warm.     Findings: No rash.  Neurological:     Mental Status: He is alert and oriented to person, place, and time.  Psychiatric:        Behavior: Behavior normal.    Previous notes and tests were reviewed. The plan was reviewed with the patient/family, and all questions/concerned were addressed.  It was my pleasure to see Colten today and participate in his care. Please feel free to contact me with any questions or concerns.  Sincerely,  Wyline Mood, DO Allergy & Immunology  [facility] office: [phone] [facility] office: [phone]

## 2023-04-10 ENCOUNTER — Encounter: Payer: Self-pay | Admitting: Allergy

## 2023-04-10 ENCOUNTER — Ambulatory Visit: Payer: Medicare Other | Admitting: Allergy

## 2023-04-10 VITALS — BP 138/84 | HR 62 | Temp 97.7°F | Resp 16 | Ht 73.0 in | Wt 236.5 lb

## 2023-04-10 DIAGNOSIS — Z91038 Other insect allergy status: Secondary | ICD-10-CM | POA: Diagnosis not present

## 2023-04-10 DIAGNOSIS — K080 Exfoliation of teeth due to systemic causes: Secondary | ICD-10-CM | POA: Diagnosis not present

## 2023-04-10 MED ORDER — EPINEPHRINE 0.3 MG/0.3ML IJ SOAJ: 0.3000 mg | INTRAMUSCULAR | 1 refills | Status: AC | PRN

## 2023-04-10 NOTE — Patient Instructions (Addendum)
Stinging insect Continue to avoid. For mild symptoms you can take over the counter antihistamines such as Benadryl 1-2 tablets = 25-50mg  and monitor symptoms closely. If symptoms worsen or if you have severe symptoms including breathing issues, throat closure, significant swelling, whole body hives, severe diarrhea and vomiting, lightheadedness then inject epinephrine and seek immediate medical care afterwards. Emergency action plan in place.   Get bloodwork Depending on results will discuss continuing injections vs stopping them. We are ordering labs, so please allow 1-2 weeks for the results to come back. With the newly implemented Cures Act, the labs might be visible to you at the same time that they become visible to me. However, I will not address the results until all of the results are back, so please be patient.  In the meantime, continue recommendations in your patient instructions, including avoidance measures (if applicable), until you hear from me.  Return in about 1 year (around 04/09/2024). Or sooner if needed.

## 2023-04-11 DIAGNOSIS — Z91038 Other insect allergy status: Secondary | ICD-10-CM | POA: Diagnosis not present

## 2023-04-20 LAB — HYMENOPTERA VENOM ALLERGY PROF
I001-IgE Honeybee: 0.8 kU/L — AB
I003-IgE Yellow Jacket: 9.12 kU/L — AB
I004-IgE Paper Wasp: 1.86 kU/L — AB
I208-IgE Api m 1: <0.1 kU/L
I209-IgE Ves v 5: 6.41 kU/L — AB
I210-IgE Pol d 5: 2.23 kU/L — AB
I211-IgE Ves v 1: 0.24 kU/L — AB
I214-IgE Api m 2: <0.1 kU/L
I215-IgE Api m 3: <0.1 kU/L
I216-IgE Api m 5: 7.95 kU/L — AB
I217-IgE Api m 10: <0.1 kU/L
Tryptase: 7.1 ug/L (ref 2.2–13.2)

## 2023-04-20 LAB — ALLERGEN COMPONENT COMMENTS

## 2023-04-20 LAB — TRYPTASE

## 2023-05-22 DIAGNOSIS — H04129 Dry eye syndrome of unspecified lacrimal gland: Secondary | ICD-10-CM | POA: Diagnosis not present

## 2023-05-22 DIAGNOSIS — I10 Essential (primary) hypertension: Secondary | ICD-10-CM | POA: Diagnosis not present

## 2023-05-22 DIAGNOSIS — H35373 Puckering of macula, bilateral: Secondary | ICD-10-CM | POA: Diagnosis not present

## 2023-05-22 DIAGNOSIS — H35033 Hypertensive retinopathy, bilateral: Secondary | ICD-10-CM | POA: Diagnosis not present

## 2023-05-24 DIAGNOSIS — H8102 Meniere's disease, left ear: Secondary | ICD-10-CM | POA: Diagnosis not present

## 2023-05-24 DIAGNOSIS — H9193 Unspecified hearing loss, bilateral: Secondary | ICD-10-CM | POA: Diagnosis not present

## 2023-05-24 DIAGNOSIS — Z01118 Encounter for examination of ears and hearing with other abnormal findings: Secondary | ICD-10-CM | POA: Diagnosis not present

## 2023-05-24 DIAGNOSIS — H90A22 Sensorineural hearing loss, unilateral, left ear, with restricted hearing on the contralateral side: Secondary | ICD-10-CM | POA: Diagnosis not present

## 2023-05-24 DIAGNOSIS — H9312 Tinnitus, left ear: Secondary | ICD-10-CM | POA: Diagnosis not present

## 2023-06-12 ENCOUNTER — Ambulatory Visit (INDEPENDENT_AMBULATORY_CARE_PROVIDER_SITE_OTHER): Payer: Medicare Other

## 2023-06-12 DIAGNOSIS — Z91038 Other insect allergy status: Secondary | ICD-10-CM

## 2023-07-23 DIAGNOSIS — N1831 Chronic kidney disease, stage 3a: Secondary | ICD-10-CM | POA: Diagnosis not present

## 2023-07-23 DIAGNOSIS — I129 Hypertensive chronic kidney disease with stage 1 through stage 4 chronic kidney disease, or unspecified chronic kidney disease: Secondary | ICD-10-CM | POA: Diagnosis not present

## 2023-07-23 DIAGNOSIS — C61 Malignant neoplasm of prostate: Secondary | ICD-10-CM | POA: Diagnosis not present

## 2023-07-23 DIAGNOSIS — E785 Hyperlipidemia, unspecified: Secondary | ICD-10-CM | POA: Diagnosis not present

## 2023-08-25 DIAGNOSIS — Z03818 Encounter for observation for suspected exposure to other biological agents ruled out: Secondary | ICD-10-CM | POA: Diagnosis not present

## 2023-08-25 DIAGNOSIS — R051 Acute cough: Secondary | ICD-10-CM | POA: Diagnosis not present

## 2023-08-25 DIAGNOSIS — J020 Streptococcal pharyngitis: Secondary | ICD-10-CM | POA: Diagnosis not present

## 2023-08-28 ENCOUNTER — Ambulatory Visit (INDEPENDENT_AMBULATORY_CARE_PROVIDER_SITE_OTHER): Payer: Medicare Other

## 2023-08-28 DIAGNOSIS — Z91038 Other insect allergy status: Secondary | ICD-10-CM | POA: Diagnosis not present

## 2023-08-28 DIAGNOSIS — C61 Malignant neoplasm of prostate: Secondary | ICD-10-CM | POA: Diagnosis not present

## 2023-10-16 DIAGNOSIS — E785 Hyperlipidemia, unspecified: Secondary | ICD-10-CM | POA: Diagnosis not present

## 2023-10-16 DIAGNOSIS — C61 Malignant neoplasm of prostate: Secondary | ICD-10-CM | POA: Diagnosis not present

## 2023-10-16 DIAGNOSIS — Z1212 Encounter for screening for malignant neoplasm of rectum: Secondary | ICD-10-CM | POA: Diagnosis not present

## 2023-10-23 DIAGNOSIS — K080 Exfoliation of teeth due to systemic causes: Secondary | ICD-10-CM | POA: Diagnosis not present

## 2023-10-30 ENCOUNTER — Ambulatory Visit (INDEPENDENT_AMBULATORY_CARE_PROVIDER_SITE_OTHER): Payer: Medicare Other

## 2023-10-30 DIAGNOSIS — R82998 Other abnormal findings in urine: Secondary | ICD-10-CM | POA: Diagnosis not present

## 2023-10-30 DIAGNOSIS — Z91038 Other insect allergy status: Secondary | ICD-10-CM

## 2023-10-30 DIAGNOSIS — Z1331 Encounter for screening for depression: Secondary | ICD-10-CM | POA: Diagnosis not present

## 2023-10-30 DIAGNOSIS — I129 Hypertensive chronic kidney disease with stage 1 through stage 4 chronic kidney disease, or unspecified chronic kidney disease: Secondary | ICD-10-CM | POA: Diagnosis not present

## 2023-10-30 DIAGNOSIS — Z1339 Encounter for screening examination for other mental health and behavioral disorders: Secondary | ICD-10-CM | POA: Diagnosis not present

## 2023-10-30 DIAGNOSIS — Z0000 Encounter for general adult medical examination without abnormal findings: Secondary | ICD-10-CM | POA: Diagnosis not present

## 2023-10-30 DIAGNOSIS — Z23 Encounter for immunization: Secondary | ICD-10-CM | POA: Diagnosis not present

## 2024-01-01 ENCOUNTER — Ambulatory Visit (INDEPENDENT_AMBULATORY_CARE_PROVIDER_SITE_OTHER)

## 2024-01-01 DIAGNOSIS — Z91038 Other insect allergy status: Secondary | ICD-10-CM

## 2024-02-06 DIAGNOSIS — H5203 Hypermetropia, bilateral: Secondary | ICD-10-CM | POA: Diagnosis not present

## 2024-02-06 DIAGNOSIS — H52223 Regular astigmatism, bilateral: Secondary | ICD-10-CM | POA: Diagnosis not present

## 2024-02-06 DIAGNOSIS — H524 Presbyopia: Secondary | ICD-10-CM | POA: Diagnosis not present

## 2024-02-06 DIAGNOSIS — H35033 Hypertensive retinopathy, bilateral: Secondary | ICD-10-CM | POA: Diagnosis not present

## 2024-02-06 DIAGNOSIS — H04129 Dry eye syndrome of unspecified lacrimal gland: Secondary | ICD-10-CM | POA: Diagnosis not present

## 2024-02-06 DIAGNOSIS — H43813 Vitreous degeneration, bilateral: Secondary | ICD-10-CM | POA: Diagnosis not present

## 2024-02-06 DIAGNOSIS — H35373 Puckering of macula, bilateral: Secondary | ICD-10-CM | POA: Diagnosis not present

## 2024-02-22 DIAGNOSIS — N3041 Irradiation cystitis with hematuria: Secondary | ICD-10-CM | POA: Diagnosis not present

## 2024-02-22 DIAGNOSIS — C61 Malignant neoplasm of prostate: Secondary | ICD-10-CM | POA: Diagnosis not present

## 2024-02-29 DIAGNOSIS — L570 Actinic keratosis: Secondary | ICD-10-CM | POA: Diagnosis not present

## 2024-02-29 DIAGNOSIS — X32XXXD Exposure to sunlight, subsequent encounter: Secondary | ICD-10-CM | POA: Diagnosis not present

## 2024-02-29 DIAGNOSIS — D485 Neoplasm of uncertain behavior of skin: Secondary | ICD-10-CM | POA: Diagnosis not present

## 2024-03-04 ENCOUNTER — Ambulatory Visit (INDEPENDENT_AMBULATORY_CARE_PROVIDER_SITE_OTHER)

## 2024-03-04 DIAGNOSIS — Z91038 Other insect allergy status: Secondary | ICD-10-CM | POA: Diagnosis not present

## 2024-04-23 DIAGNOSIS — N1831 Chronic kidney disease, stage 3a: Secondary | ICD-10-CM | POA: Diagnosis not present

## 2024-04-23 DIAGNOSIS — I129 Hypertensive chronic kidney disease with stage 1 through stage 4 chronic kidney disease, or unspecified chronic kidney disease: Secondary | ICD-10-CM | POA: Diagnosis not present

## 2024-04-24 DIAGNOSIS — K080 Exfoliation of teeth due to systemic causes: Secondary | ICD-10-CM | POA: Diagnosis not present

## 2024-05-06 ENCOUNTER — Ambulatory Visit (INDEPENDENT_AMBULATORY_CARE_PROVIDER_SITE_OTHER)

## 2024-05-06 DIAGNOSIS — Z91038 Other insect allergy status: Secondary | ICD-10-CM | POA: Diagnosis not present

## 2024-05-26 DIAGNOSIS — C61 Malignant neoplasm of prostate: Secondary | ICD-10-CM | POA: Diagnosis not present

## 2024-05-29 DIAGNOSIS — H903 Sensorineural hearing loss, bilateral: Secondary | ICD-10-CM | POA: Diagnosis not present

## 2024-05-29 DIAGNOSIS — H8102 Meniere's disease, left ear: Secondary | ICD-10-CM | POA: Diagnosis not present

## 2024-05-29 DIAGNOSIS — H9312 Tinnitus, left ear: Secondary | ICD-10-CM | POA: Diagnosis not present

## 2024-05-29 DIAGNOSIS — H9193 Unspecified hearing loss, bilateral: Secondary | ICD-10-CM | POA: Diagnosis not present

## 2024-05-29 DIAGNOSIS — H9122 Sudden idiopathic hearing loss, left ear: Secondary | ICD-10-CM | POA: Diagnosis not present

## 2024-07-08 ENCOUNTER — Ambulatory Visit

## 2024-07-08 DIAGNOSIS — Z91038 Other insect allergy status: Secondary | ICD-10-CM

## 2024-08-05 ENCOUNTER — Other Ambulatory Visit (HOSPITAL_COMMUNITY): Payer: Self-pay | Admitting: Urology

## 2024-08-05 DIAGNOSIS — C61 Malignant neoplasm of prostate: Secondary | ICD-10-CM

## 2024-08-14 ENCOUNTER — Encounter (HOSPITAL_COMMUNITY)
Admission: RE | Admit: 2024-08-14 | Discharge: 2024-08-14 | Disposition: A | Source: Ambulatory Visit | Attending: Urology | Admitting: Urology

## 2024-08-14 DIAGNOSIS — C61 Malignant neoplasm of prostate: Secondary | ICD-10-CM | POA: Insufficient documentation

## 2024-08-14 MED ORDER — FLOTUFOLASTAT F 18 GALLIUM 296-5846 MBQ/ML IV SOLN
8.6000 | Freq: Once | INTRAVENOUS | Status: AC
  Administered 2024-08-14: 8.6 via INTRAVENOUS

## 2024-09-09 ENCOUNTER — Ambulatory Visit

## 2024-09-09 DIAGNOSIS — T63441D Toxic effect of venom of bees, accidental (unintentional), subsequent encounter: Secondary | ICD-10-CM

## 2024-09-12 ENCOUNTER — Other Ambulatory Visit: Payer: Self-pay

## 2024-09-12 DIAGNOSIS — C61 Malignant neoplasm of prostate: Secondary | ICD-10-CM

## 2024-11-11 ENCOUNTER — Ambulatory Visit
